# Patient Record
Sex: Female | Born: 1975 | Race: White | Hispanic: No | Smoking: Former smoker
Health system: Southern US, Community
[De-identification: ages and names within clinical notes are randomized; demographics above are authoritative.]

## PROBLEM LIST (undated history)

## (undated) DIAGNOSIS — R51 Headache: Secondary | ICD-10-CM

## (undated) DIAGNOSIS — G43909 Migraine, unspecified, not intractable, without status migrainosus: Secondary | ICD-10-CM

## (undated) DIAGNOSIS — F5081 Binge eating disorder: Secondary | ICD-10-CM

## (undated) DIAGNOSIS — M1990 Unspecified osteoarthritis, unspecified site: Secondary | ICD-10-CM

## (undated) DIAGNOSIS — T7840XA Allergy, unspecified, initial encounter: Secondary | ICD-10-CM

## (undated) DIAGNOSIS — R519 Headache, unspecified: Secondary | ICD-10-CM

## (undated) DIAGNOSIS — F32A Depression, unspecified: Secondary | ICD-10-CM

## (undated) DIAGNOSIS — N390 Urinary tract infection, site not specified: Secondary | ICD-10-CM

## (undated) DIAGNOSIS — F419 Anxiety disorder, unspecified: Secondary | ICD-10-CM

## (undated) DIAGNOSIS — F50819 Binge eating disorder, unspecified: Secondary | ICD-10-CM

## (undated) DIAGNOSIS — B019 Varicella without complication: Secondary | ICD-10-CM

## (undated) DIAGNOSIS — C439 Malignant melanoma of skin, unspecified: Secondary | ICD-10-CM

## (undated) DIAGNOSIS — E785 Hyperlipidemia, unspecified: Secondary | ICD-10-CM

## (undated) DIAGNOSIS — F329 Major depressive disorder, single episode, unspecified: Secondary | ICD-10-CM

## (undated) HISTORY — PX: MELANOMA EXCISION: SHX5266

## (undated) HISTORY — DX: Urinary tract infection, site not specified: N39.0

## (undated) HISTORY — DX: Anxiety disorder, unspecified: F41.9

## (undated) HISTORY — DX: Migraine, unspecified, not intractable, without status migrainosus: G43.909

## (undated) HISTORY — DX: Allergy, unspecified, initial encounter: T78.40XA

## (undated) HISTORY — DX: Headache, unspecified: R51.9

## (undated) HISTORY — DX: Unspecified osteoarthritis, unspecified site: M19.90

## (undated) HISTORY — DX: Headache: R51

## (undated) HISTORY — DX: Varicella without complication: B01.9

## (undated) HISTORY — DX: Binge eating disorder, unspecified: F50.819

## (undated) HISTORY — DX: Depression, unspecified: F32.A

## (undated) HISTORY — DX: Malignant melanoma of skin, unspecified: C43.9

## (undated) HISTORY — DX: Hyperlipidemia, unspecified: E78.5

## (undated) HISTORY — DX: Binge eating disorder: F50.81

---
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
## 1998-10-27 ENCOUNTER — Other Ambulatory Visit: Admission: RE | Admit: 1998-10-27 | Discharge: 1998-10-27 | Payer: Self-pay | Admitting: Obstetrics and Gynecology

## 1998-12-14 ENCOUNTER — Observation Stay (HOSPITAL_COMMUNITY): Admission: AD | Admit: 1998-12-14 | Discharge: 1998-12-15 | Payer: Self-pay | Admitting: Gynecology

## 1998-12-15 ENCOUNTER — Encounter: Payer: Self-pay | Admitting: Gynecology

## 1999-01-29 ENCOUNTER — Encounter: Payer: Self-pay | Admitting: Gynecology

## 1999-01-29 ENCOUNTER — Observation Stay (HOSPITAL_COMMUNITY): Admission: AD | Admit: 1999-01-29 | Discharge: 1999-01-30 | Payer: Self-pay | Admitting: Gynecology

## 1999-04-30 ENCOUNTER — Encounter (INDEPENDENT_AMBULATORY_CARE_PROVIDER_SITE_OTHER): Payer: Self-pay

## 1999-04-30 ENCOUNTER — Inpatient Hospital Stay (HOSPITAL_COMMUNITY): Admission: AD | Admit: 1999-04-30 | Discharge: 1999-05-03 | Payer: Self-pay | Admitting: Gynecology

## 1999-06-18 ENCOUNTER — Other Ambulatory Visit: Admission: RE | Admit: 1999-06-18 | Discharge: 1999-06-18 | Payer: Self-pay | Admitting: Gynecology

## 2001-08-10 ENCOUNTER — Other Ambulatory Visit: Admission: RE | Admit: 2001-08-10 | Discharge: 2001-08-10 | Payer: Self-pay | Admitting: Gynecology

## 2002-02-20 ENCOUNTER — Inpatient Hospital Stay (HOSPITAL_COMMUNITY): Admission: AD | Admit: 2002-02-20 | Discharge: 2002-02-23 | Payer: Self-pay | Admitting: Gynecology

## 2002-04-01 ENCOUNTER — Other Ambulatory Visit: Admission: RE | Admit: 2002-04-01 | Discharge: 2002-04-01 | Payer: Self-pay | Admitting: Gynecology

## 2003-06-12 ENCOUNTER — Other Ambulatory Visit: Admission: RE | Admit: 2003-06-12 | Discharge: 2003-06-12 | Payer: Self-pay | Admitting: Gynecology

## 2003-06-25 ENCOUNTER — Encounter: Admission: RE | Admit: 2003-06-25 | Discharge: 2003-06-25 | Payer: Self-pay | Admitting: Family Medicine

## 2003-07-08 ENCOUNTER — Encounter: Admission: RE | Admit: 2003-07-08 | Discharge: 2003-07-08 | Payer: Self-pay | Admitting: Infectious Diseases

## 2003-07-15 ENCOUNTER — Encounter: Admission: RE | Admit: 2003-07-15 | Discharge: 2003-07-15 | Payer: Self-pay | Admitting: Infectious Diseases

## 2004-07-08 ENCOUNTER — Other Ambulatory Visit: Admission: RE | Admit: 2004-07-08 | Discharge: 2004-07-08 | Payer: Self-pay | Admitting: Gynecology

## 2004-11-02 ENCOUNTER — Ambulatory Visit: Payer: Self-pay | Admitting: General Practice

## 2005-07-15 ENCOUNTER — Other Ambulatory Visit: Admission: RE | Admit: 2005-07-15 | Discharge: 2005-07-15 | Payer: Self-pay | Admitting: Gynecology

## 2005-10-25 IMAGING — CR DG CHEST 2V
2 series · 2 of 2 positions shown · non-contrast
Comparison: none

CLINICAL DATA: Fever, cough, assess for pneumonia.
 CHEST, TWO VIEWS
 Heart and mediastinum are normal.  Lungs are clear.  No effusions.  No soft tissue or bony abnormality.
 IMPRESSION
 1.  Normal chest.

[view not recorded (1 of 2)]
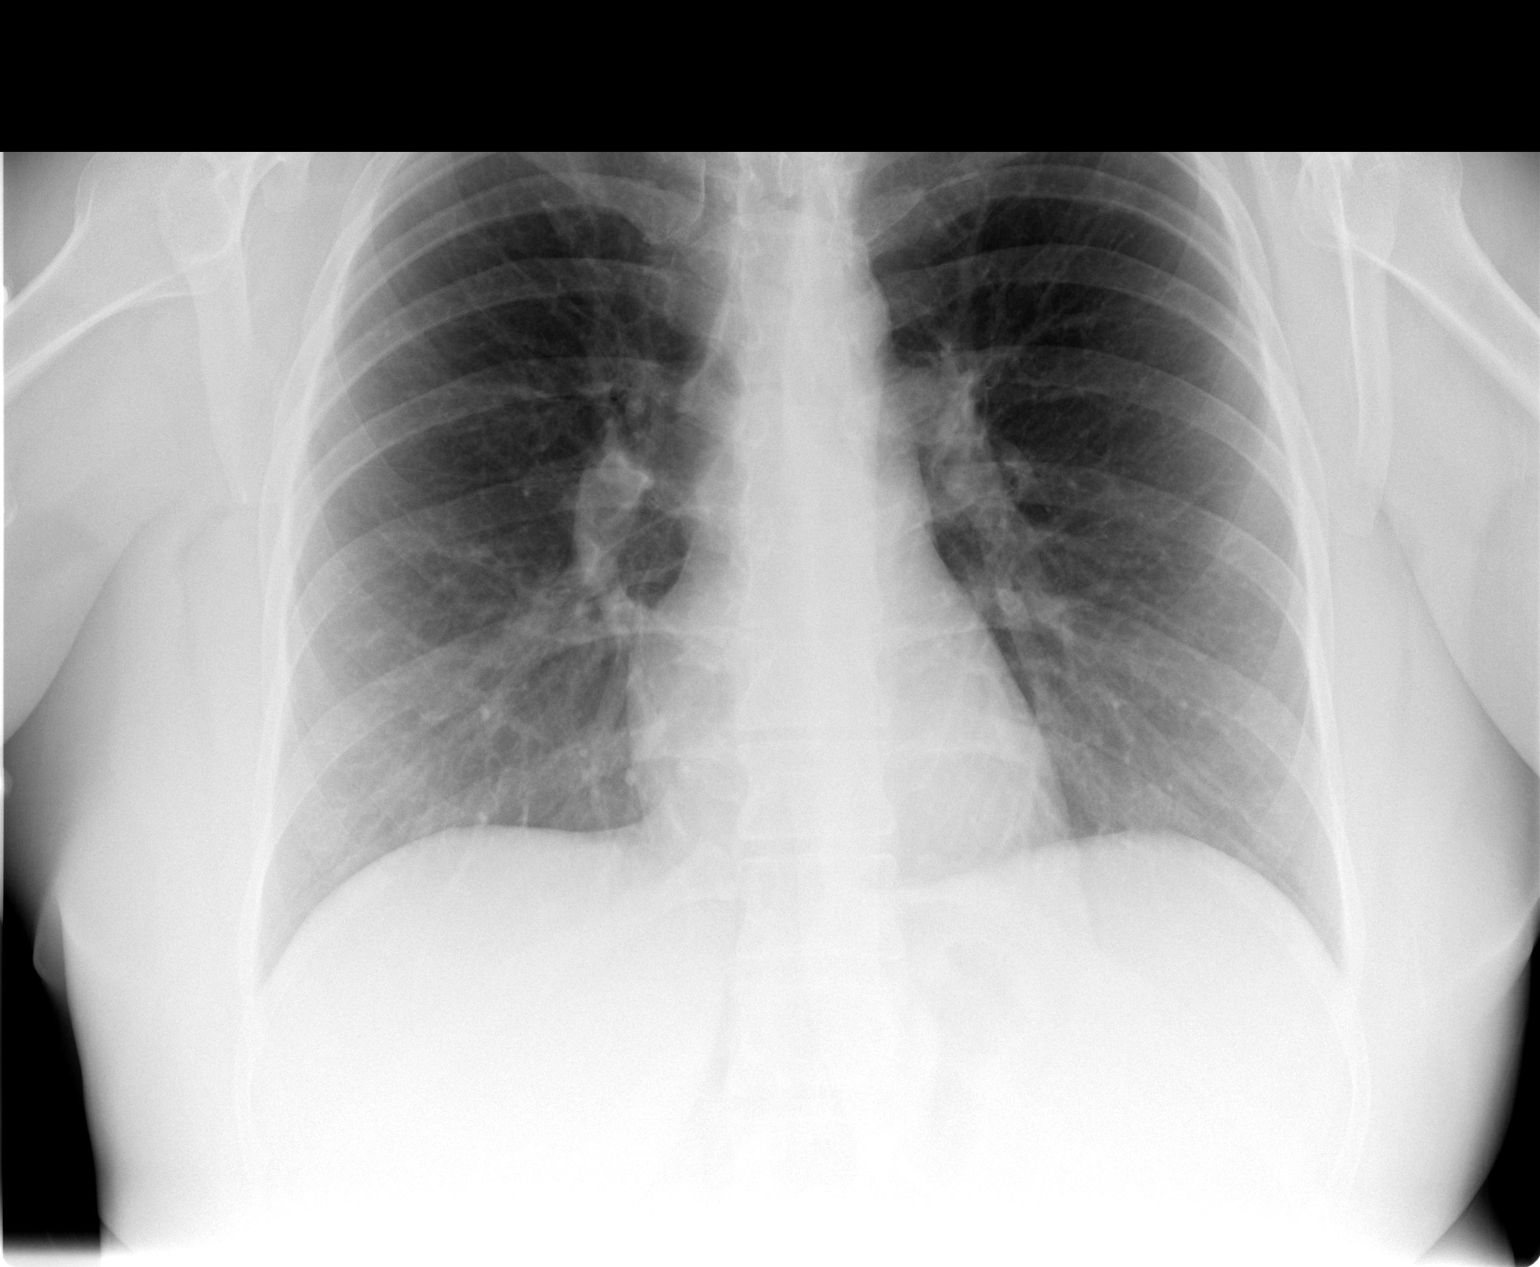

[view not recorded (2 of 2)]
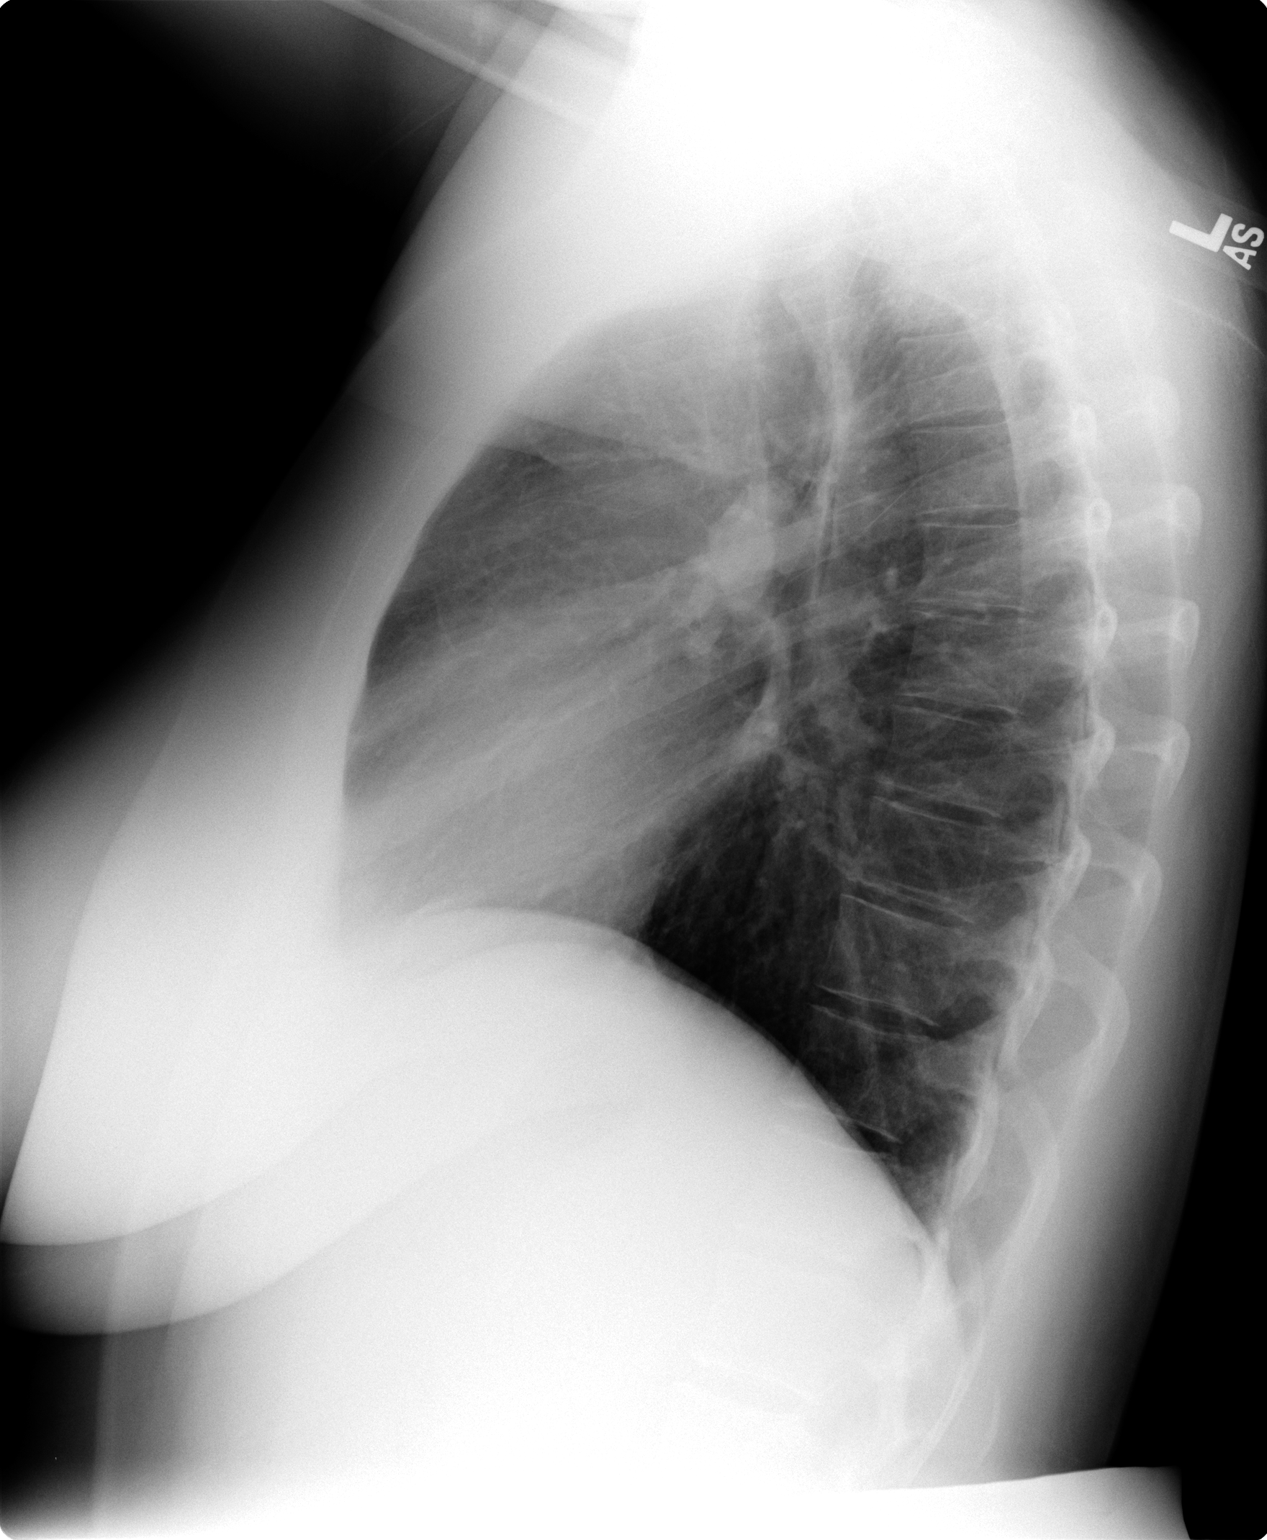

[2 of 2 positions shown; findings below may reference images not displayed]

## 2006-07-26 ENCOUNTER — Other Ambulatory Visit: Admission: RE | Admit: 2006-07-26 | Discharge: 2006-07-26 | Payer: Self-pay | Admitting: Gynecology

## 2008-05-09 ENCOUNTER — Ambulatory Visit: Payer: Self-pay | Admitting: Gynecology

## 2008-05-09 ENCOUNTER — Encounter: Payer: Self-pay | Admitting: Gynecology

## 2008-05-09 ENCOUNTER — Other Ambulatory Visit: Admission: RE | Admit: 2008-05-09 | Discharge: 2008-05-09 | Payer: Self-pay | Admitting: Gynecology

## 2009-01-31 HISTORY — PX: TONSILLECTOMY: SHX5217

## 2009-08-12 ENCOUNTER — Ambulatory Visit: Payer: Self-pay | Admitting: Gynecology

## 2009-08-12 ENCOUNTER — Other Ambulatory Visit: Admission: RE | Admit: 2009-08-12 | Discharge: 2009-08-12 | Payer: Self-pay | Admitting: Gynecology

## 2010-06-18 NOTE — Op Note (Signed)
NAME:  Sylvia [name], Sylvia [name]                          ACCOUNT NO.:  [id number]   MEDICAL RECORD NO.:  [id number]                   PATIENT TYPE:  INP   LOCATION:  9198                                 FACILITY:  [facility]   PHYSICIAN:  Timothy P. Fontaine, M.D.           DATE OF BIRTH:  11/01/1975   DATE OF PROCEDURE:  02/20/2002  DATE OF DISCHARGE:                                 OPERATIVE REPORT   PREOPERATIVE DIAGNOSES:  1. Pregnancy at term.  2. Prior cesarean section, desires repeat cesarean section.   POSTOPERATIVE DIAGNOSES:  1. Pregnancy at term.  2. Prior cesarean section, desires repeat cesarean section.   PROCEDURE:  Repeat low transverse cervical cesarean section.   SURGEON:  Timothy P. Fontaine, M.D.   ASSISTANT:  Ivor Costa. Farrel Gobble, M.D.   ANESTHESIA:  Spinal.   ESTIMATED BLOOD LOSS:  Less than 500 cubic centimeters.   COMPLICATIONS:  None.   SPECIMENS:  Samples of cord blood.   FINDINGS:  At 53 normal female infant.  Apgars 8 and 9.  Weight 8 pounds 7  ounces.  Umbilical hernia was noted in the infant.  The patient's pelvic  anatomy is noted to be normal.   PROCEDURE:  The patient is taken to the operating room.  Underwent spinal  anesthesia.  Was placed in the left tilt supine position.  Received an  abdominal preparation of Betadine solution.  The bladder was emptied with an  indwelling Foley catheter placed in [name] sterile technique.  The patient was  draped in the usual fashion after assuring adequate anesthesia.  Abdomen was  sharply entered through [name] repeat Pfannenstiel incision achieving adequate  hemostasis at all levels.  The bladder flap was sharply and bluntly  developed without difficulty and the uterus was sharply entered in the lower  uterine segment and bluntly extended laterally.  The membranes were  ruptured, the fluid noted to be clear.  The infant's head delivered through  the incision with the assistance of the vacuum extractor.  Nares and mouth  suctioned.  The rest of the infant delivered.  The cord doubly clamped and  cut and the infant was handed to pediatrics in attendance.  Samples of cord  bloods were obtained.  Placenta was then spontaneously extruded, noted to be  intact.  The uterus was exteriorized, the endometrial cavity explored with [name]  sponge to remove all placental and membrane fragments.  The uterine incision  was closed in one layer using 0 Vicryl suture in [name] running interlocking  stitch.  Several oozing areas were addressed using 0 Vicryl suture in figure-  of-eight interrupted stitch.  Uterus was then returned to the abdomen which  was copiously irrigated.  Adequate hemostasis visualized and the anterior  fascia was then reapproximated using 0 Vicryl suture in [name] running stitch  starting at the angle and meeting in the middle.  The subcutaneous tissues  were irrigated.  Adequate hemostasis  achieved with electrocautery  and the skin was reapproximated using 4-0 Vicryl in [name] running subcuticular  stitch.  Steri-Strips with Benzoin were applied.  Sterile dressing applied.  The patient was taken to recovery room in good condition having tolerated  procedure well.                                               Timothy P. Audie Box, M.D.    TPF/MEDQ  D:  02/20/2002  T:  02/20/2002  Job:  [id number]

## 2010-06-18 NOTE — H&P (Signed)
[facility]  Patient:    Sylvia [name]                        MRN: [id number] Adm. Date:  [id number] Attending:  Merrily Pew                         History and Physical  CHIEF COMPLAINT:              1. Right-sided pain.                               2. Pregnancy at 22 weeks.  HISTORY OF PRESENT ILLNESS:   [age]-year-old G3, P0, Ab1 female at 22-weeks gestation, doing well until today, with the onset of right-sided pain.  Patient  describes this as being midabdominal, radiating to her back, started dull, aching and has increased since then to a constant throbbing quality.  She denies uterine cramping, contractions, vaginal bleeding, rupture of membranes.  Does note some  urinary hesitancy although no frank dysuria.  Denies fever or chills.  Had bowel movement today without difficulty.  Last meal was lunch.  Did have some nausea earlier this evening although now feels better except for the persistence and worsening of her pain.  PAST MEDICAL HISTORY:         Uncomplicated.  PAST SURGICAL HISTORY:        Uncomplicated.  ALLERGIES:                    PENICILLIN.  MEDICATIONS:                  Prenatal vitamins.  SOCIAL HISTORY:               Cigarettes:  None.  Alcohol:  None.  PHYSICAL EXAMINATION:  GENERAL:                      Temperature 99, blood pressure 130/79, pulse 102.  HEENT:                        Normal.  LUNGS:                        Clear.  CARDIAC:                      Regular rate.  No gross rubs, murmurs or gallops.  ABDOMEN:                      Uterus appropriate for dates.  Positive fetal heart tones.  No contractions on external monitor.  Abdomen and uterus are soft without elicited tenderness.  No rebound, guarding or organomegaly.  BACK:                         Straight.  No CVA tenderness.  PELVIC:                       Cervix long, closed.  No evidence of rupture of membranes or  bleeding.  LABORATORY STUDIES:           Urinalysis is negative.  CBC shows a white count f 17,000.  Hematocrit -- mild anemia.  ASSESSMENT:  [age]-year-old gravida 2, para 2 female, 22-weeks gestation, right-sided pain from back to mid-abdomen, elevated white count, normal urinalysis.  Differential includes: #1 - Atypical appendicitis with elevated white count, although her physical exam is normal; #2 - renal lithiasis although again, her urinalysis is normal and without significant costovertebral  angle tenderness; #3 - cholelithiasis although Chem-24 ordered, now pending, physical exam certainly without any subcostal tenderness and pain appears to be  lower; #4 - pregnancy related, i.e., abruptio, certainly no overt evidence of this with soft uterus, no uterine activity on external monitors and no uterine tenderness.  PLAN:                         Will admit this evening, IV hydrate, keep n.p.o.,  repeat CBC in the morning and follow her symptoms this evening.  Will give her ild pain medicine with IV Demerol 25 to 50 mg and if pain persists and/or certainly  worsens with persistence of her elevated white count, may have general surgery ee in the morning.  If symptoms totally clear, then will follow expectantly. Chem-24 pending at this time and plan may certainly change pending these results. DD:  12/14/98 TD:  12/15/98 Job: [id number]

## 2010-06-18 NOTE — H&P (Signed)
   NAME:  Sylvia Sylvia [name], Sylvia Sylvia [name]                          ACCOUNT NO.:  [id number]   MEDICAL RECORD NO.:  [id number]                   PATIENT TYPE:  INP   LOCATION:  NA                                   FACILITY:  [facility]   PHYSICIAN:  Timothy P. Fontaine, M.D.           DATE OF BIRTH:  [birth year]   DATE OF ADMISSION:  02/20/2002  DATE OF DISCHARGE:                                HISTORY & PHYSICAL   CHIEF COMPLAINT:  Pregnancy at term, history of prior cesarean section,  desires repeat cesarean section.   HISTORY OF PRESENT ILLNESS:  The patient is Sylvia [name] 35 year-old Gravida III, Para  1, AB 1 female at [redacted] weeks gestation with Sylvia [name] history of prior cesarean  section for fetal macrosomia who desires repeat cesarean section after  counseling for Sylvia [name] trial of labor.  See [place] for remainder of her history  and physical.   ASSESSMENT:  37 seven year-old Sylvia Sylvia [name] ([place]) III, Para 1 female, [redacted] weeks  gestation for repeat cesarean section.  The risks, benefits, indications and  alternatives for the procedure were reviewed with the patient to include the  alternatives such as trial of labor.  The expected intraoperative and  postoperative courses were reviewed and the risks to include bleeding,  transfusion to include transfusion reaction, hepatitis, HIV, mad cow disease  and other unknown entities, infection both internal requiring prolonged  antibiotics as well as incisional complications requiring opening and  draining of incisions, closure by secondary intention were all discussed,  understood and accepted.  The risks of inadvertent injury to internal organs  including bowel, bladder, ureters, vessels and nerves necessitating major  exploratory reparative surgeries and future reparative surgeries including  ostomy formation were all discussed, understood and accepted.  The risks of  fetal injury during the procedure to include musculoskeletal, neural and  scalp injuries were all discussed, understood and  accepted.  The patient's  questions were answered to her satisfaction and she is ready to proceed with  surgery. The patient was questioned about tubal sterilization and she is not  interested in tubal sterilization.                                               Timothy P. Audie Box, M.D.    TPF/MEDQ  D:  02/19/2002  T:  02/19/2002  Job:  [id number]

## 2010-06-18 NOTE — Op Note (Signed)
[facility]  Patient:    Sylvia [name]                        MRN: [id number] Proc. Date: 04/30/99 Adm. Date:  [id number] Disc. Date: [id number] Attending:  Tonye Royalty                           Operative Report  PREOPERATIVE DIAGNOSES:       1. Pregnancy at [redacted] weeks gestation.                               2. Suspected fetal macrosomia.  POSTOPERATIVE DIAGNOSES:      1. Pregnancy at [redacted] weeks gestation.                               2. Suspected fetal macrosomia.  PROCEDURE:                    Primary low transverse cervical cesarean section.  SURGEON:                      Timothy P. Fontaine, M.D.  ANESTHESIA:                   Regional.  ESTIMATED BLOOD LOSS:         500 cc.  COMPLICATIONS:                None.  SPECIMEN:                     Placenta, samples of cord blood.  FINDINGS:                     At 68 normal female infant, Apgars 9 and 9, weight 9 pounds 12 ounces.  Pelvic anatomy noted to be normal.  PROCEDURE:                    Patient was taken to the operating room. Underwent regional anesthesia.  Was placed in the left tilt supine position.  Received an  abdominal preparation with Betadine scrub and Betadine solution.  Foley catheter was placed in a sterile technique and the patient was draped in the usual fashion. After assuring adequate anesthesia, the abdomen was sharply entered through a Pfannenstiel incision achieving adequate hemostasis at all levels.  The bladder  flap was then sharply and bluntly developed without difficulty.  The uterus was  sharply entered in the lower uterine segment.  The membranes ruptured and the uterine incision extended laterally bluntly.  The infants head was then delivered through the incision with the assistance of the vacuum extractor, the nares and  mouth suctioned, the rest of the infant delivered, the cord doubly clamped and ut, and the infant handed to pediatrics  in attendance.  Samples of cord blood were obtained.  The placenta was then spontaneously extruded, noted to be intact, and sent to pathology.  Patient received 1 g Cefotan IV prophylaxis.  The uterus was then exteriorized.  The endometrial cavity explored with the sponge to remove all placental membrane fragments.  The uterine incision was closed in one layer using 0 Vicryl suture in a running interlocking stitch.  Adequate hemostasis was visualized.  The uterus was  subsequently returned to the abdomen which was copiously irrigated.  Again, hemostasis visualized.  The anterior fascia reapproximated using 0 Vicryl suture in a running stitch.  Subcutaneous tissues  were irrigated.  Adequate hemostasis achieved with electrocautery.  The skin was reapproximated with staples.  Sterile dressing was applied and the patient was taken to the recovery room in good condition having tolerated procedure well. DD:  04/30/99 TD:  04/30/99 Job: [id number]

## 2010-06-18 NOTE — Discharge Summary (Signed)
   NAME:  Sylvia [name], SCRONCE A                          ACCOUNT NO.:  [id number]   MEDICAL RECORD NO.:  [id number]                   PATIENT TYPE:  INP   LOCATION:  9134                                 FACILITY:  [facility]   PHYSICIAN:  Juan H. Lily Peer, M.D.             DATE OF BIRTH:  [birth year]   DATE OF ADMISSION:  02/20/2002  DATE OF DISCHARGE:  02/23/2002                                 DISCHARGE SUMMARY   DISCHARGE DIAGNOSES:  1. Intrauterine pregnancy at 39 weeks, delivered.  2. History of prior cesarean section, desired repeat cesarean section.  3. Status post repeat low transverse cesarean section by Dr. Colin Broach on February 20, 2002.  4. History of group B strep with her previous pregnancy.  5. Upper respiratory infection.   HISTORY:  This is a 26-years-of-age female gravida 3 para 1 with an EDC by  sonogram of February 23, 2002.  Prenatal course had been complicated by  history of positive group B strep with previous pregnancy.  She had a  history of prior cesarean section and desired repeat cesarean section.   HOSPITAL COURSE:  On February 20, 2002 the patient was admitted and underwent  a repeat low transverse cesarean section by Dr. Colin Broach and  underwent delivery of a female, Apgars of 8 and 9, weight of 8 pounds 7  ounces.  It was noted that there was an umbilical hernia noted in the  infant.  There were no complications.  Postoperatively, the patient remained  afebrile, voiding, and in stable condition.  However, she did complain on  February 23, 2002 of sinus congestion, sore throat, and nonproductive cough.  On exam there were mild inspiratory rhonchi.  Therefore, the patient was  diagnosed with an upper respiratory infection and was given a prescription  for Ceftin.  She was discharged to home, however, in satisfactory condition.   ACCESSORY CLINICAL FINDINGS/LABORATORY DATA:  The patient is A positive,  rubella immune.   DISPOSITION:  1. The patient is  discharged to home.  2. Given a prescription for Ceftin 500 mg one p.o. b.i.d. x6 days.  3. Iron daily.  4. Tylox p.r.n. pain.     Susa Loffler, P.A.                    Juan H. Lily Peer, M.D.    TSG/MEDQ  D:  03/22/2002  T:  03/22/2002  Job:  [id number]

## 2010-06-18 NOTE — H&P (Signed)
[facility]  Patient:    Sylvia [name]                        MRN: [id number] Adm. Date:  [id number] Attending:  Tonye Royalty                         History and Physical  CHIEF COMPLAINT:  Left lower back pain.  HISTORY:  The patient is a 35 year old gravida 3, para 0, ab 1, currently 29-1/[redacted] week gestation.  Presented to the emergency room at [facility] early this morning complaining of low back pain that woke her up this morning.  She was previously admitted for observation on November 13 with pains that had started n her right abdomen and moving towards her back.  At that point she had an extensive evaluation to include blood work as well as ultrasound of the abdomen with negative findings.  The patient felt better after the observation and was discharged home. Today when she presented to [facility], her vital signs were as follows: Blood pressure 141/89, pulse 99, temperature pending at time of dictation.  She was placed on the monitor and reassuring fetal heart rate tracing was noted but no evidence of any contraction.  PHYSICAL EXAMINATION:  HEENT:  Unremarkable, but neck supple, trachea midline.  No carotid bruits, no thyromegaly.  LUNGS:  Clear to auscultation without any rhonchi or wheezes.  HEART:  Regular rate and rhythm.  No murmurs or gallops.  BREAST:  Examination not done.  The patient had some lumbosacral tenderness at the pressure point paraspinous lumbosacral region.  She had good motor and sensory deficit although she stated  that when walking she would feel discomfort in her back shooting through her side.  PELVIC:  Cervix is long, closed and posterior.  EXTREMITIES:  Deep tendon reflexes 1+, negative clonus, trace edema.  There was  negative Rovsing, negative obturator, negative psoas and negative heel tap sign. Her abdomen was soft and nontender without any rebound or  guarding.  LABORATORY DATA:  The patient had a following CBC with a white blood cell count of 13,900, hemoglobin and hematocrit of 10.5 and 31.0.  Platelet count 154,000. Comprehensive metabolic panel raises some issue due to the fact that her potassium was found to be elevated at 6.2 and the SGOT was elevated at 89 and total bilirubin was 2.3.  Patient was not jaundiced and wondering if the blood sample that was obtained was hemolyzed.  She also had an ultrasound as well as a urinalysis. The abdominal ultrasound did not reveal any evidence of cholelithiasis or biliary dilatation.  There was mild bilateral pelviectasis consistent with pregnancy. he remainder of the abdominal ultrasound was reported to be negative as reported by the radiologist.  The obstetrical ultrasound did not demonstrate any evidence of any abruptio.  The size is consistent with the dates.  Fetus was in the vertex presentation.  Subjectively the amniotic fluid ____________ to be low with an AFI of 10.4, 23rd percentile although due to patient discomfort, moving around, an adequate assessment was not possible.  Estimated fetal weight was 1528 gm. Urinalysis was negative.  Specific gravity was 1.020.  There was no evidence of  ketonuria.  ASSESSMENT:  35 year old gravida 3, para 0, ab 1 at 29-1/[redacted] weeks gestation with  left lumbosacral pains radiating to the left abdominal side.  Last month she was admitted for observation with pain  starting in the right lower abdomen and shooting to her back.  On examination and a working diagnosis of possibility of (1) herniated disk, (2) ruptured disk, (3) sciatica, (4) lumbosacral strain ____________ patient stated this pain occurs twice a week since she left the hospital was worse this morning.  She did receive 50 mg of Demerol and 12.5 mg f Phenergan IM which states that gave her minimal resolution.  She looks uncomfortable but exam does really not correlate with  patients symptomatology or the work-up so far.  Will plan on admitting her, hydrating her, repeating a comprehensive metabolic panel to see if indeed it was a lab error due to possible hemolysis of the blood due to the hyperkalemia and the elevated bilirubin and elevated SGOT.  We will proceed with an MRI of the lumbosacral region to rule out herniated disk or ruptured disk and if so, will proceed with consultation with  neurosurgeon or orthopedist.  Will continue to monitor antepartum to rule out any evidence of premature labor.  She has had a reassuring fetal heart rate tracing in maternity admission with no evidence of contractions noted.  Her vital signs have also been stable as well.  All of these issues were discussed with the patient nd will continue to monitor accordingly and all questions were answered.  PLAN:  As per assessment above. DD:  01/29/99 TD:  01/29/99 Job: [id number]

## 2010-06-18 NOTE — Discharge Summary (Signed)
[facility]  Patient:    Sylvia [name], Sylvia [name]                       MRN: [id number] Adm. Date:  [id number] Disc. Date: [id number] Attending:  Merrily Pew Dictator:   Antony Contras, RNC, [facility]                           Discharge Summary  DISCHARGE DIAGNOSES:          1. Intrauterine pregnancy at 41 weeks.                               2. Fetal macrosomia.                               3. Oligohydramnios.                               4. Early labor.                               5. Delivery of viable infant.  PROCEDURE:                    Low transverse cesarean section.  HISTORY OF PRESENT ILLNESS:   The patient is a 35 year old, gravida 2, para 0-0-1-0, with an LMP of July 16, 1998, [facility] April 27, 1999.  Pregnancy was complicated by oligohydramnios and fetal macrosomia which became evident at the end of the pregnancy.  PRENATAL LABORATORY DATA:     Blood type A positive, antibody screen negative, rubella positive.  RPR, HBSAG, and HIV nonreactive.  MSAFP normal.  Glucose within normal limits.  HOSPITAL COURSE:              The patient was admitted for low transverse cesarean section due to macrosomia and the oligohydramnios.  The procedure was performed by Timothy P. Fontaine, M.D. under regional anesthesia.  She was delivered of an Apgars 9 and 37 female infant, weight 9 pounds 12 ounces.  Estimated blood loss was 500 cc.  Postoperative course was within normal limits.  She remained afebrile,  voiding well, and was able to be discharged on her third postoperative day. Postoperative CBC; hemoglobin 11.3, hematocrit 32.4, platelets 117, white count  12.4.  Discharged with her infant in satisfactory condition.  DISPOSITION:                  Follow up in four to six weeks.  Continue with prenatal vitamins and iron.  Tylox or Motrin for pain. DD:  05/28/99 TD:  06/02/99 Job: [id number]

## 2010-06-18 NOTE — H&P (Signed)
[facility]  Patient:    Sylvia [name]                        MRN: [id number] Adm. Date:  [id number] Disc. Date: [id number] Attending:  Tonye Royalty                         History and Physical  CHIEF COMPLAINT:              Pregnancy at 41 weeks, fetal macrosomia, oligohydramnios, early labor.  HISTORY OF PRESENT ILLNESS:   [age]-year-old G2, P0, Ab1 female at 41-weeks gestation, evaluated in the office with ultrasound which showed an AFI of 2.2 cm. Estimated fetal weight was 4400 g and on a reactive fetal tracing, she was contracting every six minute.  PAST MEDICAL HISTORY:         Uncomplicated.  PAST SURGICAL HISTORY:        D&C; wisdom tooth extraction.  ALLERGIES:                    PENICILLIN.  MEDICATIONS:                  Vitamins.  REVIEW OF SYSTEMS:            Noncontributory.  FAMILY HISTORY:               Noncontributory.  SOCIAL HISTORY:               No cigarettes or alcohol use.  PHYSICAL EXAMINATION:  VITAL SIGNS:                  Afebrile.  Vital signs are stable.  HEENT:                        Normal.  LUNGS:                        Clear.  CARDIAC:                      Regular rate.  No murmurs, rubs, or gallops.  ABDOMEN:                      Gravid vertex presentation, reactive fetal tracing, contractions every six minutes.  PELVIC:                       Cervix fingertip, 80-90%, -3 station.  ASSESSMENT:                   [age]-year-old gravida 2, para 0, abortus 1  female, 41-weeks gestation, with ultrasound estimated fetal weight of 4400 g, clinically large infant on exam, probable early labor with oligohydramnios. Discussed with patient and her husband the issues of estimated fetal weight by ultrasound and the issues of fetal macrosomia leading to increased risk for cesarean section as well as increased risk for shoulder dystocia.  Options of expectant management with spontaneous  labor allowing to progress with no heroic  interventions with operative vaginal delivery and attempting vaginal delivery versus primary cesarean section were reviewed and the patient and her husband elect for primary cesarean section.  The risks of C-section were discussed with them o include the risks of infection, uterine and peritoneal, leading to prolonged antibiotics, as well as incisional requiring  opening and draining of incisions nd closure by by secondary intention, risk of bleeding leading to hemorrhage necessitating transfusion and the risks of transfusion up to and including human immunodeficiency virus were discussed with her.  The risks of inadvertent injury to internal organs including bowel, bladder, ureters, vessels and nerves necessitating major exploratory or reparative surgeries and future reparative surgeries including ostomy formation were discussed, understood and accepted.  After an extensive discussion to include the inherent inaccuracies in ultrasound estimated fetal weights, the risks of labor with a large infant to include cesarean section after a long trial of labor as well as shoulder dystocia versus a primary cesarean section with the risks of a cesarean section were all reviewed, the patient and her husband want to proceed with C-section.  The patients questions were answered to her satisfaction and she is ready to proceed with surgery. DD:  04/30/99 TD:  04/30/99 Job: [id number]

## 2010-08-17 ENCOUNTER — Other Ambulatory Visit: Payer: Self-pay | Admitting: Gynecology

## 2010-08-17 ENCOUNTER — Other Ambulatory Visit (HOSPITAL_COMMUNITY)
Admission: RE | Admit: 2010-08-17 | Discharge: 2010-08-17 | Disposition: A | Discharge status: Home / Self Care | Payer: BC Managed Care – PPO | Source: Ambulatory Visit | Attending: Gynecology | Admitting: Gynecology

## 2010-08-17 ENCOUNTER — Encounter (INDEPENDENT_AMBULATORY_CARE_PROVIDER_SITE_OTHER): Payer: BC Managed Care – PPO | Admitting: Gynecology

## 2010-08-17 DIAGNOSIS — Z124 Encounter for screening for malignant neoplasm of cervix: Secondary | ICD-10-CM | POA: Insufficient documentation

## 2010-08-17 DIAGNOSIS — Z833 Family history of diabetes mellitus: Secondary | ICD-10-CM

## 2010-08-17 DIAGNOSIS — Z01419 Encounter for gynecological examination (general) (routine) without abnormal findings: Secondary | ICD-10-CM

## 2010-08-17 DIAGNOSIS — R809 Proteinuria, unspecified: Secondary | ICD-10-CM

## 2010-08-17 DIAGNOSIS — Z13220 Encounter for screening for lipoid disorders: Secondary | ICD-10-CM

## 2010-08-27 ENCOUNTER — Encounter: Payer: Self-pay | Admitting: Gynecology

## 2010-08-27 ENCOUNTER — Ambulatory Visit (INDEPENDENT_AMBULATORY_CARE_PROVIDER_SITE_OTHER): Payer: BC Managed Care – PPO | Admitting: Gynecology

## 2010-08-27 VITALS — BP 122/78

## 2010-08-27 DIAGNOSIS — Z30431 Encounter for routine checking of intrauterine contraceptive device: Secondary | ICD-10-CM

## 2010-08-27 MED ORDER — LEVONORGESTREL 20 MCG/24HR IU IUD
INTRAUTERINE_SYSTEM | Freq: Once | INTRAUTERINE | Status: AC
  Administered 2010-08-27: 10:00:00 via INTRAUTERINE

## 2010-08-27 NOTE — Progress Notes (Signed)
  Sylvia Bell 1975-07-31 [id number]   Patient presents to have her IUD replaced at a five-year interval. She's without complaints and doing well with the IUD.   Exam  Pelvic: External genitalia:  Normal   BUS/Urethra/Skene's glands:  Normal   Bladder:  Normal   Vagina:  Normal   Cervix:  Normal IUD string not visualized  Uterus:  Anti-verted, normal in size, shape and contour.  Midline and mobile nontender   Adnexa/parametria:  Without masses or tenderness   Anus and perineum: Normal      Procedure note :  The patient was counseled as to the removal and reinsertion process and I discussed the risks to include infection both short-term and long-term uterine perforation or IUD migration requiring surgery to remove as well as the risk of failure and pregnancy with IUD. She understands and accepts all of this she has read through the Mirena booklet and has signed the consent form which has been scanned into her Epic chart.  Patient was examined as above subsequently cervix visualized with speculum cleansed with Betadine initial probing of the os was unable to grasp the IUD string. Subsequently anterior lip of the cervix was grasped with a single-tooth tenaculum and using the [facility] forcep inserted up into the upper endocervical canal I was able to grab the string and ultimately retrieve her IUD. I showed her the old IUD and discarded subsequently uterus was sounded and a new Mirena IUD was placed according to manufacturer's recommendations. String was trimmed tenaculum removed patient tolerated procedure well and will followup for a postinsertional check in one month.    Dara Lords MD, 10:05 AM 08/27/2010

## 2010-08-30 ENCOUNTER — Other Ambulatory Visit: Payer: Self-pay | Admitting: Gynecology

## 2010-09-01 ENCOUNTER — Other Ambulatory Visit: Payer: Self-pay | Admitting: *Deleted

## 2010-09-01 DIAGNOSIS — F419 Anxiety disorder, unspecified: Secondary | ICD-10-CM

## 2010-09-01 MED ORDER — VENLAFAXINE HCL ER 150 MG PO CP24: 150.0000 mg | ORAL_CAPSULE | Freq: Every day | ORAL | Status: DC

## 2010-09-23 ENCOUNTER — Ambulatory Visit (INDEPENDENT_AMBULATORY_CARE_PROVIDER_SITE_OTHER): Payer: BC Managed Care – PPO | Admitting: Gynecology

## 2010-09-23 ENCOUNTER — Encounter: Payer: Self-pay | Admitting: Gynecology

## 2010-09-23 DIAGNOSIS — Z30431 Encounter for routine checking of intrauterine contraceptive device: Secondary | ICD-10-CM

## 2010-09-23 NOTE — Progress Notes (Signed)
Patient presents for IUD check doing well no complaints.  Exam Pelvic: External BUS vagina normal, cervix normal IUD string not visualized, bimanual uterus normal size midline mobile nontender, adnexa without masses or tenderness.  Colposcopic evaluation still unable to see the string at the cervical os  Assessment and plan: IUD check unable to see the string. Patient will schedule ultrasound to assure intrauterine placement.

## 2010-10-07 ENCOUNTER — Ambulatory Visit (INDEPENDENT_AMBULATORY_CARE_PROVIDER_SITE_OTHER): Payer: BC Managed Care – PPO | Admitting: Gynecology

## 2010-10-07 ENCOUNTER — Other Ambulatory Visit: Payer: BC Managed Care – PPO

## 2010-10-07 DIAGNOSIS — Z30431 Encounter for routine checking of intrauterine contraceptive device: Secondary | ICD-10-CM

## 2010-10-07 DIAGNOSIS — T8339XA Other mechanical complication of intrauterine contraceptive device, initial encounter: Secondary | ICD-10-CM

## 2010-10-07 DIAGNOSIS — D259 Leiomyoma of uterus, unspecified: Secondary | ICD-10-CM

## 2010-10-07 DIAGNOSIS — D251 Intramural leiomyoma of uterus: Secondary | ICD-10-CM

## 2010-10-07 DIAGNOSIS — N831 Corpus luteum cyst of ovary, unspecified side: Secondary | ICD-10-CM

## 2010-10-07 NOTE — Progress Notes (Signed)
Patient presents for ultrasound for IUD placement. 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 The IUD is in the proper location endometrial echo is 10 mm.  Discussed all this with the patient. Assuming she continues well then she will see [place] when she is due for her annual next July sooner as needed.

## 2013-10-11 ENCOUNTER — Ambulatory Visit: Payer: Self-pay | Admitting: Gynecology

## 2013-12-02 ENCOUNTER — Encounter: Payer: Self-pay | Admitting: Gynecology

## 2013-12-10 ENCOUNTER — Ambulatory Visit: Payer: Self-pay | Admitting: Gynecology

## 2014-01-15 ENCOUNTER — Ambulatory Visit: Payer: Self-pay | Admitting: Gynecology

## 2014-03-04 ENCOUNTER — Other Ambulatory Visit (HOSPITAL_COMMUNITY)
Admission: RE | Admit: 2014-03-04 | Discharge: 2014-03-04 | Disposition: A | Discharge status: Home / Self Care | Payer: BC Managed Care – PPO | Source: Ambulatory Visit | Attending: Gynecology | Admitting: Gynecology

## 2014-03-04 ENCOUNTER — Encounter: Payer: Self-pay | Admitting: Gynecology

## 2014-03-04 ENCOUNTER — Ambulatory Visit (INDEPENDENT_AMBULATORY_CARE_PROVIDER_SITE_OTHER): Payer: BC Managed Care – PPO | Admitting: Gynecology

## 2014-03-04 VITALS — BP 122/78 | Ht 69.0 in | Wt 235.0 lb

## 2014-03-04 DIAGNOSIS — Z1151 Encounter for screening for human papillomavirus (HPV): Secondary | ICD-10-CM | POA: Diagnosis present

## 2014-03-04 DIAGNOSIS — Z01419 Encounter for gynecological examination (general) (routine) without abnormal findings: Secondary | ICD-10-CM | POA: Diagnosis not present

## 2014-03-04 DIAGNOSIS — Z30431 Encounter for routine checking of intrauterine contraceptive device: Secondary | ICD-10-CM

## 2014-03-04 LAB — CBC WITH DIFFERENTIAL/PLATELET
BASOS ABS: 0 10*3/uL (ref 0.0–0.1)
Basophils Relative: 0 % (ref 0–1)
Eosinophils Absolute: 0.1 10*3/uL (ref 0.0–0.7)
Eosinophils Relative: 1 % (ref 0–5)
HEMATOCRIT: 42.6 % (ref 36.0–46.0)
HEMOGLOBIN: 14.4 g/dL (ref 12.0–15.0)
LYMPHS PCT: 22 % (ref 12–46)
Lymphs Abs: 2.6 10*3/uL (ref 0.7–4.0)
MCH: 28.8 pg (ref 26.0–34.0)
MCHC: 33.8 g/dL (ref 30.0–36.0)
MCV: 85.2 fL (ref 78.0–100.0)
MPV: 11 fL (ref 8.6–12.4)
Monocytes Absolute: 0.7 10*3/uL (ref 0.1–1.0)
Monocytes Relative: 6 % (ref 3–12)
NEUTROS ABS: 8.5 10*3/uL — AB (ref 1.7–7.7)
NEUTROS PCT: 71 % (ref 43–77)
Platelets: 243 10*3/uL (ref 150–400)
RBC: 5 MIL/uL (ref 3.87–5.11)
RDW: 13.6 % (ref 11.5–15.5)
WBC: 12 10*3/uL — AB (ref 4.0–10.5)

## 2014-03-04 NOTE — Progress Notes (Signed)
PENNELOPE BASQUE 1975/02/15 [id number]        39 y.o.  [id number] for annual exam.  Has not been in the office since 2012. Several issues noted below.  Past medical history,surgical history, problem list, medications, allergies, family history and social history were all reviewed and documented as reviewed in the EPIC chart.  ROS:  Performed with pertinent positives and negatives included in the history, assessment and plan.   Additional significant findings :  none   Exam: Kim Counsellor Vitals:   03/04/14 1552  BP: 122/78  Height: 5\' 9"  (1.753 m)  Weight: 235 lb (106.595 kg)   General appearance:  Normal affect, orientation and appearance. Skin: Grossly normal HEENT: Without gross lesions.  No cervical or supraclavicular adenopathy. Thyroid normal.  Lungs:  Clear without wheezing, rales or rhonchi Cardiac: RR, without RMG Abdominal:  Soft, nontender, without masses, guarding, rebound, organomegaly or hernia Breasts:  Examined lying and sitting without masses, retractions, discharge or axillary adenopathy. Pelvic:  Ext/BUS/vagina normal  Cervix normal. IUD string not visualized or palpated. Pap/HPV  Uterus anteverted, normal size, shape and contour, midline and mobile nontender   Adnexa  Without masses or tenderness    Anus and perineum  Normal   Rectovaginal  Normal sphincter tone without palpated masses or tenderness.    Assessment/Plan:  39 y.o. [id number] female for annual exam without menses, Mirena IUD 08/2010.   1. Mirena 08/2010. Patient doing well without menses. String not visualized. Has not been in the past noting ultrasound demonstrated intrauterine placement at last visit. Remains amenorrheic. Options to relook now with ultrasound or follow reviewed. Patient's comfortable with following now. Due to have replaced in 08/2015 2. Pap smear 2012. Pap/HPV today. No history of significant abnormal Pap smears previously. 3. Mammography several years ago. Turning 39 this fall.  Recommend mammogram this year. Does have a history of her mother being diagnosed in her 44s with breast cancer and her maternal grandmother also being diagnosed with breast cancer. I discussed with her in the past and I again reviewed today the possibility of genetic linkage. She is unclear whether her mother was genetically tested although she is being seen by an oncologist. I reviewed with her the implications if her mother is gene positive for the patient's testing in what she would do with the results to include prophylactic surgeries as well as more intensive surveillance such as MRIs. The patient's going to discuss with her mother to verify whether she was genetically tested. If there is any question I offered the patient the option to discuss with a genetic counselor and to have genetic testing done for herself. Patient will follow up after talking to her mother she wants to pursue genetic testing or has any further questions. Regardless recommended mammogram this year and every year there after. SBE monthly reviewed. 4. Stop smoking reviewed. 5. Health maintenance. Baseline CBC comprehensive metabolic panel lipid profile urinalysis TSH ordered. Follow up in one year, sooner as needed.     Anastasio Auerbach MD, 4:48 PM 03/04/2014

## 2014-03-04 NOTE — Patient Instructions (Signed)
Call to Schedule your mammogram  Facilities in [place]: 1)  [facility], [address] Phone: [phone] 2)  [facility]. [address] [place] Phone: [phone] 3)  Dr. Isaiah Blakes at [facility] [address] Phone: [phone]     Mammogram A mammogram is an X-ray test to find changes in a woman's breast. You should get a mammogram if:  You are 40 years of age or older  You have risk factors.   Your doctor recommends that you have one.  BEFORE THE TEST  Do not schedule the test the week before your period, especially if your breasts are sore during this time.  On the day of your mammogram:  Wash your breasts and armpits well. After washing, do not put on any deodorant or talcum powder on until after your test.   Eat and drink as you usually do.   Take your medicines as usual.   If you are diabetic and take insulin, make sure you:   Eat before coming for your test.   Take your insulin as usual.   If you cannot keep your appointment, call before the appointment to cancel. Schedule another appointment.  TEST  You will need to undress from the waist up. You will put on a hospital gown.   Your breast will be put on the mammogram machine, and it will press firmly on your breast with a piece of plastic called a compression paddle. This will make your breast flatter so that the machine can X-ray all parts of your breast.   Both breasts will be X-rayed. Each breast will be X-rayed from above and from the side. An X-ray might need to be taken again if the picture is not good enough.   The mammogram will last about 15 to 30 minutes.  AFTER THE TEST Finding out the results of your test Ask when your test results will be ready. Make sure you get your test results.  Document Released: 04/15/2008 Document Revised: 01/06/2011 Document Reviewed: 04/15/2008 [facility] Patient  Information 2012 [place].  You may obtain a copy of any labs that were done today by logging onto MyChart as outlined in the instructions provided with your AVS (after visit summary). The office will not call with normal lab results but certainly if there are any significant abnormalities then we will contact you.   Health Maintenance, Female A healthy lifestyle and preventative care can promote health and wellness.  Maintain regular health, dental, and eye exams.  Eat a healthy diet. Foods like vegetables, fruits, whole grains, low-fat dairy products, and lean protein foods contain the nutrients you need without too many calories. Decrease your intake of foods high in solid fats, added sugars, and salt. Get information about a proper diet from your caregiver, if necessary.  Regular physical exercise is one of the most important things you can do for your health. Most adults should get at least 150 minutes of moderate-intensity exercise (any activity that increases your heart rate and causes you to sweat) each week. In addition, most adults need muscle-strengthening exercises on 2 or more days a week.   Maintain a healthy weight. The body mass index (BMI) is a screening tool to identify possible weight problems. It provides an estimate of body fat based on height and weight. Your caregiver can help determine your BMI, and can help you achieve or maintain a healthy weight. For adults  20 years and older:  A BMI below 18.5 is considered underweight.  A BMI of 18.5 to 24.9 is normal.  A BMI of 25 to 29.9 is considered overweight.  A BMI of 30 and above is considered obese.  Maintain normal blood lipids and cholesterol by exercising and minimizing your intake of saturated fat. Eat a balanced diet with plenty of fruits and vegetables. Blood tests for lipids and cholesterol should begin at age 20 and be repeated every 5 years. If your lipid or cholesterol levels are high, you are over 50, or  you are a high risk for heart disease, you may need your cholesterol levels checked more frequently.Ongoing high lipid and cholesterol levels should be treated with medicines if diet and exercise are not effective.  If you smoke, find out from your caregiver how to quit. If you do not use tobacco, do not start.  Lung cancer screening is recommended for adults aged 55 80 years who are at high risk for developing lung cancer because of a history of smoking. Yearly low-dose computed tomography (CT) is recommended for people who have at least a 30-pack-year history of smoking and are a current smoker or have quit within the past 15 years. A pack year of smoking is smoking an average of 1 pack of cigarettes a day for 1 year (for example: 1 pack a day for 30 years or 2 packs a day for 15 years). Yearly screening should continue until the smoker has stopped smoking for at least 15 years. Yearly screening should also be stopped for people who develop a health problem that would prevent them from having lung cancer treatment.  If you are pregnant, do not drink alcohol. If you are breastfeeding, be very cautious about drinking alcohol. If you are not pregnant and choose to drink alcohol, do not exceed 1 drink per day. One drink is considered to be 12 ounces (355 mL) of beer, 5 ounces (148 mL) of wine, or 1.5 ounces (44 mL) of liquor.  Avoid use of street drugs. Do not share needles with anyone. Ask for help if you need support or instructions about stopping the use of drugs.  High blood pressure causes heart disease and increases the risk of stroke. Blood pressure should be checked at least every 1 to 2 years. Ongoing high blood pressure should be treated with medicines, if weight loss and exercise are not effective.  If you are 55 to 40 years old, ask your caregiver if you should take aspirin to prevent strokes.  Diabetes screening involves taking a blood sample to check your fasting blood sugar level. This  should be done once every 3 years, after age 45, if you are within normal weight and without risk factors for diabetes. Testing should be considered at a younger age or be carried out more frequently if you are overweight and have at least 1 risk factor for diabetes.  Breast cancer screening is essential preventative care for women. You should practice "breast self-awareness." This means understanding the normal appearance and feel of your breasts and may include breast self-examination. Any changes detected, no matter how small, should be reported to a caregiver. Women in their 20s and 30s should have a clinical breast exam (CBE) by a caregiver as part of a regular health exam every 1 to 3 years. After age 40, women should have a CBE every year. Starting at age 40, women should consider having a mammogram (breast X-ray) every year. Women who have a   family history of breast cancer should talk to their caregiver about genetic screening. Women at a high risk of breast cancer should talk to their caregiver about having an MRI and a mammogram every year.  Breast cancer gene (BRCA)-related cancer risk assessment is recommended for women who have family members with BRCA-related cancers. BRCA-related cancers include breast, ovarian, tubal, and peritoneal cancers. Having family members with these cancers may be associated with an increased risk for harmful changes (mutations) in the breast cancer genes BRCA1 and BRCA2. Results of the assessment will determine the need for genetic counseling and BRCA1 and BRCA2 testing.  The Pap test is a screening test for cervical cancer. Women should have a Pap test starting at age 74. Between ages 61 and 30, Pap tests should be repeated every 2 years. Beginning at age 40, you should have a Pap test every 3 years as long as the past 3 Pap tests have been normal. If you had a hysterectomy for a problem that was not cancer or a condition that could lead to cancer, then you no longer  need Pap tests. If you are between ages 61 and 63, and you have had normal Pap tests going back 10 years, you no longer need Pap tests. If you have had past treatment for cervical cancer or a condition that could lead to cancer, you need Pap tests and screening for cancer for at least 20 years after your treatment. If Pap tests have been discontinued, risk factors (such as a new sexual partner) need to be reassessed to determine if screening should be resumed. Some women have medical problems that increase the chance of getting cervical cancer. In these cases, your caregiver may recommend more frequent screening and Pap tests.  The human papillomavirus (HPV) test is an additional test that may be used for cervical cancer screening. The HPV test looks for the virus that can cause the cell changes on the cervix. The cells collected during the Pap test can be tested for HPV. The HPV test could be used to screen women aged 71 years and older, and should be used in women of any age who have unclear Pap test results. After the age of 30, women should have HPV testing at the same frequency as a Pap test.  Colorectal cancer can be detected and often prevented. Most routine colorectal cancer screening begins at the age of 48 and continues through age 41. However, your caregiver may recommend screening at an earlier age if you have risk factors for colon cancer. On a yearly basis, your caregiver may provide home test kits to check for hidden blood in the stool. Use of a small camera at the end of a tube, to directly examine the colon (sigmoidoscopy or colonoscopy), can detect the earliest forms of colorectal cancer. Talk to your caregiver about this at age 56, when routine screening begins. Direct examination of the colon should be repeated every 5 to 10 years through age 51, unless early forms of pre-cancerous polyps or small growths are found.  Hepatitis C blood testing is recommended for all people born from [birth year]  through 1965 and any individual with known risks for hepatitis C.  Practice safe sex. Use condoms and avoid high-risk sexual practices to reduce the spread of sexually transmitted infections (STIs). Sexually active women aged 40 and younger should be checked for Chlamydia, which is a common sexually transmitted infection. Older women with new or multiple partners should also be tested for Chlamydia. Testing for  other STIs is recommended if you are sexually active and at increased risk.  Osteoporosis is a disease in which the bones lose minerals and strength with aging. This can result in serious bone fractures. The risk of osteoporosis can be identified using a bone density scan. Women ages 49 and over and women at risk for fractures or osteoporosis should discuss screening with their caregivers. Ask your caregiver whether you should be taking a calcium supplement or vitamin D to reduce the rate of osteoporosis.  Menopause can be associated with physical symptoms and risks. Hormone replacement therapy is available to decrease symptoms and risks. You should talk to your caregiver about whether hormone replacement therapy is right for you.  Use sunscreen. Apply sunscreen liberally and repeatedly throughout the day. You should seek shade when your shadow is shorter than you. Protect yourself by wearing long sleeves, pants, a wide-brimmed hat, and sunglasses year round, whenever you are outdoors.  Notify your caregiver of new moles or changes in moles, especially if there is a change in shape or color. Also notify your caregiver if a mole is larger than the size of a pencil eraser.  Stay current with your immunizations. Document Released: 08/02/2010 Document Revised: 05/14/2012 Document Reviewed: 08/02/2010 [facility] Patient Information 2014 [place].

## 2014-03-04 NOTE — Addendum Note (Signed)
Addended by: Nelva Nay on: 03/04/2014 05:01 PM   Modules accepted: Orders

## 2014-03-05 ENCOUNTER — Other Ambulatory Visit: Payer: Self-pay | Admitting: Gynecology

## 2014-03-05 DIAGNOSIS — E7800 Pure hypercholesterolemia, unspecified: Secondary | ICD-10-CM

## 2014-03-05 LAB — TSH: TSH: 0.909 u[IU]/mL (ref 0.350–4.500)

## 2014-03-05 LAB — COMPREHENSIVE METABOLIC PANEL
ALBUMIN: 4.2 g/dL (ref 3.5–5.2)
ALT: 19 U/L (ref 0–35)
AST: 14 U/L (ref 0–37)
Alkaline Phosphatase: 61 U/L (ref 39–117)
BUN: 12 mg/dL (ref 6–23)
CALCIUM: 9.7 mg/dL (ref 8.4–10.5)
CO2: 25 meq/L (ref 19–32)
Chloride: 105 mEq/L (ref 96–112)
Creat: 0.84 mg/dL (ref 0.50–1.10)
GLUCOSE: 91 mg/dL (ref 70–99)
Potassium: 4 mEq/L (ref 3.5–5.3)
SODIUM: 139 meq/L (ref 135–145)
TOTAL PROTEIN: 7 g/dL (ref 6.0–8.3)
Total Bilirubin: 0.3 mg/dL (ref 0.2–1.2)

## 2014-03-05 LAB — LIPID PANEL
CHOL/HDL RATIO: 4.6 ratio
CHOLESTEROL: 203 mg/dL — AB (ref 0–200)
HDL: 44 mg/dL (ref >39)
LDL Cholesterol: 139 mg/dL — ABNORMAL HIGH (ref 0–99)
Triglycerides: 99 mg/dL (ref <150)
VLDL: 20 mg/dL (ref 0–40)

## 2014-03-05 LAB — URINALYSIS W MICROSCOPIC + REFLEX CULTURE
BILIRUBIN URINE: NEGATIVE
Bacteria, UA: NONE SEEN
Casts: NONE SEEN
Crystals: NONE SEEN
GLUCOSE, UA: NEGATIVE mg/dL
Hgb urine dipstick: NEGATIVE
KETONES UR: NEGATIVE mg/dL
LEUKOCYTES UA: NEGATIVE
Nitrite: NEGATIVE
PH: 7.5 (ref 5.0–8.0)
Protein, ur: NEGATIVE mg/dL
SQUAMOUS EPITHELIAL / LPF: NONE SEEN
Specific Gravity, Urine: 1.026 (ref 1.005–1.030)
Urobilinogen, UA: 0.2 mg/dL (ref 0.0–1.0)

## 2014-03-07 LAB — CYTOLOGY - PAP

## 2015-07-20 ENCOUNTER — Encounter: Payer: BC Managed Care – PPO | Admitting: Gynecology

## 2015-07-29 ENCOUNTER — Encounter: Payer: BC Managed Care – PPO | Admitting: Gynecology

## 2015-09-01 ENCOUNTER — Telehealth: Payer: Self-pay | Admitting: Gynecology

## 2015-09-01 ENCOUNTER — Ambulatory Visit (INDEPENDENT_AMBULATORY_CARE_PROVIDER_SITE_OTHER): Payer: BC Managed Care – PPO | Admitting: Gynecology

## 2015-09-01 ENCOUNTER — Encounter: Payer: Self-pay | Admitting: Gynecology

## 2015-09-01 VITALS — BP 124/78 | Ht 69.0 in | Wt 240.0 lb

## 2015-09-01 DIAGNOSIS — Z13220 Encounter for screening for lipoid disorders: Secondary | ICD-10-CM

## 2015-09-01 DIAGNOSIS — Z803 Family history of malignant neoplasm of breast: Secondary | ICD-10-CM

## 2015-09-01 DIAGNOSIS — Z01419 Encounter for gynecological examination (general) (routine) without abnormal findings: Secondary | ICD-10-CM

## 2015-09-01 HISTORY — PX: OTHER SURGICAL HISTORY: SHX169

## 2015-09-01 NOTE — Patient Instructions (Signed)
Call if you want to pursue genetic counseling in reference to your family history of breast cancer.   Call to Schedule your mammogram  Facilities in [place]: 1)  [facility]. [address] [place] [address] [place] Phone: [phone] 2)  Dr. Isaiah Blakes at [facility] [address] Phone: [phone]     Mammogram A mammogram is an X-ray test to find changes in a woman's breast. You should get a mammogram if:  You are 5 years of age or older  You have risk factors.   Your doctor recommends that you have one.  BEFORE THE TEST  Do not schedule the test the week before your period, especially if your breasts are sore during this time.  On the day of your mammogram:  Wash your breasts and armpits well. After washing, do not put on any deodorant or talcum powder on until after your test.   Eat and drink as you usually do.   Take your medicines as usual.   If you are diabetic and take insulin, make sure you:   Eat before coming for your test.   Take your insulin as usual.   If you cannot keep your appointment, call before the appointment to cancel. Schedule another appointment.  TEST  You will need to undress from the waist up. You will put on a hospital gown.   Your breast will be put on the mammogram machine, and it will press firmly on your breast with a piece of plastic called a compression paddle. This will make your breast flatter so that the machine can X-ray all parts of your breast.   Both breasts will be X-rayed. Each breast will be X-rayed from above and from the side. An X-ray might need to be taken again if the picture is not good enough.   The mammogram will last about 15 to 30 minutes.  AFTER THE TEST Finding out the results of your test Ask when your test results will be ready. Make sure you get your test results.  Document Released: 04/15/2008 Document Revised: 01/06/2011 Document Reviewed:  04/15/2008 [facility] Patient Information 2012 [place].

## 2015-09-01 NOTE — Telephone Encounter (Signed)
09/01/15-I LM VM for pt to advise her that her [facility] will cover the replacement of her Mirena for contraception with her $40 copay. Per [id number]

## 2015-09-01 NOTE — Progress Notes (Signed)
    Sylvia Bell [birth year] [id number]        40 y.o.  [id number]  for annual exam.  Several issues noted below.  Past medical history,surgical history, problem list, medications, allergies, family history and social history were all reviewed and documented as reviewed in the EPIC chart.  ROS:  Performed with pertinent positives and negatives included in the history, assessment and plan.   Additional significant findings :  None   Exam: Caryn Bee assistant Vitals:   09/01/15 1543  BP: 124/78  Weight: 240 lb (108.9 kg)  Height: 5\' 9"  (1.753 m)   General appearance:  Normal affect, orientation and appearance. Skin: Grossly normal HEENT: Without gross lesions.  No cervical or supraclavicular adenopathy. Thyroid normal.  Lungs:  Clear without wheezing, rales or rhonchi Cardiac: RR, without RMG Abdominal:  Soft, nontender, without masses, guarding, rebound, organomegaly or hernia Breasts:  Examined lying and sitting without masses, retractions, discharge or axillary adenopathy. Pelvic:  Ext/BUS/Vagina normal  Cervix normal. IUD string not visualized  Uterus anteverted, normal size, shape and contour, midline and mobile nontender   Adnexa without masses or tenderness    Anus and perineum normal   Rectovaginal normal sphincter tone without palpated masses or tenderness.    Assessment/Plan:  40 y.o. [id number] female for annual exam without menses, Mirena IUD.   1. Mirena IUD 08/2010. Doing well. Has appointment scheduled to have it replaced and she will follow up for this. 2. Family history breast cancer in mother, premenopausal and maternal grandmother. I discussed genetic counseling and genetic testing in the past. Her mother apparently refuses to do so. Recommended patient schedule an appointment with a genetic counselor to discuss her family history and consider genetic testing. We discussed the various scenarios as far as what she would do if she was positive. At this point the  patient declines referral for genetic counseling but will call if she changes her mind. Strongly recommended that she schedule a baseline mammogram now and she agrees to do so. SBE monthly reviewed. 3. Continues to smoke despite recommendations otherwise. 4. Pap smear/HPV 2016. No Pap smear done today. No history of significant abnormal Pap smears previously. 5. Health maintenance. Baseline CBC, CMP, lipid profile, urinalysis ordered. Follow up for IUD replacement otherwise annual exam in one year   Anastasio Auerbach MD, 4:18 PM 09/01/2015

## 2015-09-02 ENCOUNTER — Other Ambulatory Visit: Payer: Self-pay | Admitting: Gynecology

## 2015-09-02 DIAGNOSIS — D72829 Elevated white blood cell count, unspecified: Secondary | ICD-10-CM

## 2015-09-02 DIAGNOSIS — E7800 Pure hypercholesterolemia, unspecified: Secondary | ICD-10-CM

## 2015-09-02 LAB — COMPREHENSIVE METABOLIC PANEL
ALBUMIN: 4.3 g/dL (ref 3.6–5.1)
ALK PHOS: 59 U/L (ref 33–115)
ALT: 16 U/L (ref 6–29)
AST: 14 U/L (ref 10–30)
BUN: 10 mg/dL (ref 7–25)
CALCIUM: 9.4 mg/dL (ref 8.6–10.2)
CO2: 20 mmol/L (ref 20–31)
Chloride: 105 mmol/L (ref 98–110)
Creat: 0.91 mg/dL (ref 0.50–1.10)
Glucose, Bld: 87 mg/dL (ref 65–99)
POTASSIUM: 3.8 mmol/L (ref 3.5–5.3)
Sodium: 138 mmol/L (ref 135–146)
TOTAL PROTEIN: 7 g/dL (ref 6.1–8.1)
Total Bilirubin: 0.4 mg/dL (ref 0.2–1.2)

## 2015-09-02 LAB — CBC WITH DIFFERENTIAL/PLATELET
Basophils Absolute: 0 cells/uL (ref 0–200)
Basophils Relative: 0 %
Eosinophils Absolute: 129 cells/uL (ref 15–500)
Eosinophils Relative: 1 %
HCT: 44.8 % (ref 35.0–45.0)
HEMOGLOBIN: 14.9 g/dL (ref 11.7–15.5)
LYMPHS PCT: 22 %
Lymphs Abs: 2838 cells/uL (ref 850–3900)
MCH: 28.7 pg (ref 27.0–33.0)
MCHC: 33.3 g/dL (ref 32.0–36.0)
MCV: 86.3 fL (ref 80.0–100.0)
MONO ABS: 645 {cells}/uL (ref 200–950)
MPV: 11.2 fL (ref 7.5–12.5)
Monocytes Relative: 5 %
Neutro Abs: 9288 cells/uL — ABNORMAL HIGH (ref 1500–7800)
Neutrophils Relative %: 72 %
Platelets: 196 10*3/uL (ref 140–400)
RBC: 5.19 MIL/uL — ABNORMAL HIGH (ref 3.80–5.10)
RDW: 14 % (ref 11.0–15.0)
WBC: 12.9 10*3/uL — ABNORMAL HIGH (ref 3.8–10.8)

## 2015-09-02 LAB — URINALYSIS W MICROSCOPIC + REFLEX CULTURE
BILIRUBIN URINE: NEGATIVE
Bacteria, UA: NONE SEEN [HPF]
CRYSTALS: NONE SEEN [HPF]
Casts: NONE SEEN [LPF]
Glucose, UA: NEGATIVE
KETONES UR: NEGATIVE
LEUKOCYTES UA: NEGATIVE
NITRITE: NEGATIVE
PH: 6.5 (ref 5.0–8.0)
Protein, ur: NEGATIVE
SPECIFIC GRAVITY, URINE: 1.008 (ref 1.001–1.035)
Squamous Epithelial / LPF: NONE SEEN [HPF] (ref <=5)
WBC UA: NONE SEEN WBC/HPF (ref <=5)
Yeast: NONE SEEN [HPF]

## 2015-09-02 LAB — LIPID PANEL
CHOLESTEROL: 198 mg/dL (ref 125–200)
HDL: 41 mg/dL — AB (ref >=46)
LDL Cholesterol: 130 mg/dL — ABNORMAL HIGH (ref <130)
Total CHOL/HDL Ratio: 4.8 Ratio (ref <=5.0)
Triglycerides: 134 mg/dL (ref <150)
VLDL: 27 mg/dL (ref <30)

## 2015-09-04 LAB — URINE CULTURE

## 2015-09-10 ENCOUNTER — Ambulatory Visit (INDEPENDENT_AMBULATORY_CARE_PROVIDER_SITE_OTHER): Payer: BC Managed Care – PPO | Admitting: Gynecology

## 2015-09-10 ENCOUNTER — Encounter: Payer: Self-pay | Admitting: Gynecology

## 2015-09-10 VITALS — BP 120/76

## 2015-09-10 DIAGNOSIS — Z30433 Encounter for removal and reinsertion of intrauterine contraceptive device: Secondary | ICD-10-CM | POA: Diagnosis not present

## 2015-09-10 NOTE — Patient Instructions (Signed)
Intrauterine Device Insertion Most often, an intrauterine device (IUD) is inserted into the uterus to prevent pregnancy. There are 2 types of IUDs available:  Copper IUD--This type of IUD creates an environment that is not favorable to sperm survival. The mechanism of action of the copper IUD is not known for certain. It can stay in place for 10 years.  Hormone IUD--This type of IUD contains the hormone progestin (synthetic progesterone). The progestin thickens the cervical mucus and prevents sperm from entering the uterus, and it also thins the uterine lining. There is no evidence that the hormone IUD prevents implantation. One hormone IUD can stay in place for up to 5 years, and a different hormone IUD can stay in place for up to 3 years. An IUD is the most cost-effective birth control if left in place for the full duration. It may be removed at any time. LET YOUR HEALTH CARE PROVIDER KNOW ABOUT:  Any allergies you have.  All medicines you are taking, including vitamins, herbs, eye drops, creams, and over-the-counter medicines.  Previous problems you or members of your family have had with the use of anesthetics.  Any blood disorders you have.  Previous surgeries you have had.  Possibility of pregnancy.  Medical conditions you have. RISKS AND COMPLICATIONS  Generally, intrauterine device insertion is a safe procedure. However, as with any procedure, complications can occur. Possible complications include:  Accidental puncture (perforation) of the uterus.  Accidental placement of the IUD either in the muscle layer of the uterus (myometrium) or outside the uterus. If this happens, the IUD can be found essentially floating around the bowels and must be taken out surgically.  The IUD may fall out of the uterus (expulsion). This is more common in women who have recently had a child.   Pregnancy in the fallopian tube (ectopic).  Pelvic inflammatory disease (PID), which is infection of  the uterus and fallopian tubes. The risk of PID is slightly increased in the first 20 days after the IUD is placed, but the overall risk is still very low. BEFORE THE PROCEDURE  Schedule the IUD insertion for when you will have your menstrual period or right after, to make sure you are not pregnant. Placement of the IUD is better tolerated shortly after a menstrual cycle.  You may need to take tests or be examined to make sure you are not pregnant.  You may be required to take a pregnancy test.  You may be required to get checked for sexually transmitted infections (STIs) prior to placement. Placing an IUD in someone who has an infection can make the infection worse.  You may be given a pain reliever to take 1 or 2 hours before the procedure.  An exam will be performed to determine the size and position of your uterus.  Ask your health care provider about changing or stopping your regular medicines. PROCEDURE   A tool (speculum) is placed in the vagina. This allows your health care provider to see the lower part of the uterus (cervix).  The cervix is prepped with a medicine that lowers the risk of infection.  You may be given a medicine to numb each side of the cervix (intracervical or paracervical block). This is used to block and control any discomfort with insertion.  A tool (uterine sound) is inserted into the uterus to determine the length of the uterine cavity and the direction the uterus may be tilted.  A slim instrument (IUD inserter) is inserted through the cervical   canal and into your uterus.  The IUD is placed in the uterine cavity and the insertion device is removed.  The nylon string that is attached to the IUD and used for eventual IUD removal is trimmed. It is trimmed so that it lays high in the vagina, just outside the cervix. AFTER THE PROCEDURE  You may have bleeding after the procedure. This is normal. It varies from light spotting for a few days to menstrual-like  bleeding.  You may have mild cramping.   This information is not intended to replace advice given to you by your health care provider. Make sure you discuss any questions you have with your health care provider.   Document Released: 09/15/2010 Document Revised: 11/07/2012 Document Reviewed: 07/08/2012 Elsevier Interactive Patient Education 2016 Elsevier Inc.  

## 2015-09-10 NOTE — Progress Notes (Signed)
    Sylvia Bell 26-Feb-1975 [id number]        40 y.o.  [id number]  presents for Mirena IUD replacement. She has read through the booklet, has no contraindications and signed the consent form.  I reviewed the removal and insertional process with her as well as the risks to include infection, either immediate or long-term, uterine perforation or migration requiring surgery to remove, other complications such as pain, hormonal side effects, infertility and possibility of failure with subsequent pregnancy.   Exam with Caryn Bee assistant Vitals:   09/10/15 0835  BP: 120/76    Pelvic: External BUS vagina normal. Cervix normal. IUD string not visualized. Uterus anteverted normal size shape contour midline mobile nontender. Adnexa without masses or tenderness.  Procedure: The cervix was cleansed with Betadine, anterior lip grasped with a single-tooth tenaculum, the cervix was gently dilated with a disposable dilator and using the [facility] forcep the IUD string was grasped within the endocervical canal and the old Mirena IUD was removed, shown to the patient and discarded.  The uterus was sounded and a new Mirena IUD was placed according to manufacturer's recommendations without difficulty. The strings were trimmed. The patient tolerated well and will follow up in one month for a postinsertional check.  Lot number:  [id number]    Anastasio Auerbach MD, 8:55 AM 09/10/2015

## 2015-10-08 ENCOUNTER — Ambulatory Visit: Payer: BC Managed Care – PPO | Admitting: Gynecology

## 2015-11-24 ENCOUNTER — Encounter: Payer: Self-pay | Admitting: Gynecology

## 2016-09-01 ENCOUNTER — Encounter: Payer: BC Managed Care – PPO | Admitting: Gynecology

## 2018-10-23 ENCOUNTER — Encounter: Payer: Self-pay | Admitting: Gynecology

## 2019-06-19 ENCOUNTER — Telehealth: Payer: Self-pay | Admitting: Family Medicine

## 2019-06-19 NOTE — Telephone Encounter (Signed)
Pts is calling in stating that one of her friends (ms [email or web address]) has asked if you would accept them as newpts.  They are aware that you are not taking any newpt.  Will you accept them as newpts?

## 2019-06-19 NOTE — Telephone Encounter (Signed)
Yes I can see them thanks

## 2019-06-20 NOTE — Telephone Encounter (Signed)
Pt has been called and given the below msg.  Lmom for pt to call the office to get scheduled as a newpt.

## 2019-06-24 NOTE — Telephone Encounter (Signed)
Called pt to see if she is ready to schedule an appointment.

## 2019-06-24 NOTE — Telephone Encounter (Signed)
Pt called back and was scheduled.

## 2019-07-03 ENCOUNTER — Other Ambulatory Visit: Payer: Self-pay

## 2019-07-03 ENCOUNTER — Ambulatory Visit (INDEPENDENT_AMBULATORY_CARE_PROVIDER_SITE_OTHER): Payer: BC Managed Care – PPO | Admitting: Family Medicine

## 2019-07-03 ENCOUNTER — Encounter: Payer: Self-pay | Admitting: Family Medicine

## 2019-07-03 VITALS — BP 124/64 | HR 107 | Temp 97.2°F | Wt 246.8 lb

## 2019-07-03 DIAGNOSIS — G43809 Other migraine, not intractable, without status migrainosus: Secondary | ICD-10-CM

## 2019-07-03 DIAGNOSIS — E785 Hyperlipidemia, unspecified: Secondary | ICD-10-CM | POA: Diagnosis not present

## 2019-07-03 DIAGNOSIS — F900 Attention-deficit hyperactivity disorder, predominantly inattentive type: Secondary | ICD-10-CM | POA: Diagnosis not present

## 2019-07-03 DIAGNOSIS — F418 Other specified anxiety disorders: Secondary | ICD-10-CM

## 2019-07-03 DIAGNOSIS — G43909 Migraine, unspecified, not intractable, without status migrainosus: Secondary | ICD-10-CM | POA: Insufficient documentation

## 2019-07-03 MED ORDER — AMPHETAMINE-DEXTROAMPHETAMINE 10 MG PO TABS: 10.0000 mg | ORAL_TABLET | Freq: Two times a day (BID) | ORAL | 0 refills | Status: DC

## 2019-07-03 MED ORDER — VARENICLINE TARTRATE 1 MG PO TABS: 1.0000 mg | ORAL_TABLET | Freq: Two times a day (BID) | ORAL | 0 refills | Status: DC

## 2019-07-03 MED ORDER — VENLAFAXINE HCL ER 150 MG PO CP24: 150.0000 mg | ORAL_CAPSULE | Freq: Every day | ORAL | 5 refills | Status: DC

## 2019-07-03 NOTE — Progress Notes (Signed)
° °  Subjective:    Patient ID: Sylvia Bell, female    DOB: [birth year], 44 y.o.   MRN: [id number]  HPI Here to establish with [place] after transferring from the care of Jannette Spanner PA at [facility]. She has several issues to discuss today. First she has been treated for depression and anxiety for years and she has been on several medications. For the past 2 years she has been taking Lexapro. This helped at first but now she says it does not do much for her. She has a lot of stress in her life, with being the divorced mother of her teenage boys and having a stressful job. 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 She mentions frequent episodes of palpitations where her heart feels like it beats very fast. These spells occur several times a week and they are quite brief, lasting only 5-10 seconds at a time. She sometimes feels SOB with them but there is no chest pain. She never has to stop what she is doing or to sit down. She has been trying to improve her health and over the past year she has lost over 50 lbs and she has quit smoking. She admits to using a lot of caffeine daily, including 2-3 cups of coffee and a large diet soda. Also she has been treated for years for ADHD, and this is well controlled by taking Adderall immediate release 10 mg BID.    Review of Systems  Constitutional: Negative.   Respiratory: Negative.   Cardiovascular: Positive for palpitations. Negative for chest pain and leg swelling.  Psychiatric/Behavioral: Positive for decreased concentration and dysphoric mood. Negative for agitation, behavioral problems, confusion, hallucinations, self-injury, sleep disturbance and suicidal ideas. The patient is nervous/anxious.        Objective:   Physical Exam Constitutional:      Appearance: She is obese. She is not ill-appearing.  Cardiovascular:     Rate and Rhythm: Normal rate and regular rhythm.     Pulses: Normal pulses.     Heart sounds: Normal heart sounds.  Pulmonary:       Effort: Pulmonary effort is normal.     Breath sounds: Normal breath sounds.  Neurological:     General: No focal deficit present.     Mental Status: She is alert and oriented to person, place, and time.  Psychiatric:        Mood and Affect: Mood normal.        Behavior: Behavior normal.        Thought Content: Thought content normal.        Judgment: Judgment normal.           Assessment & Plan:  Her ADHD is stable so we refilled the Adderall. Her depression and anxiety are not stable, so we will stop Lexapro and will start her on Effexor XR 150 mg daily. Recheck in one month. We will get her old records.  Alysia Penna, MD

## 2019-07-22 ENCOUNTER — Encounter: Payer: Self-pay | Admitting: Family Medicine

## 2019-07-22 DIAGNOSIS — E785 Hyperlipidemia, unspecified: Secondary | ICD-10-CM

## 2019-07-23 MED ORDER — AMPHETAMINE-DEXTROAMPHETAMINE 10 MG PO TABS: 10.0000 mg | ORAL_TABLET | Freq: Two times a day (BID) | ORAL | 0 refills | Status: DC

## 2019-07-23 MED ORDER — VENLAFAXINE HCL ER 150 MG PO CP24
150.0000 mg | ORAL_CAPSULE | Freq: Every day | ORAL | 5 refills | Status: DC
Start: 2019-07-23 — End: 2020-02-24

## 2019-07-23 NOTE — Telephone Encounter (Signed)
I put in the orders, she just needs a lab appt

## 2019-07-23 NOTE — Telephone Encounter (Signed)
I sent them in AGAIN

## 2019-09-30 ENCOUNTER — Other Ambulatory Visit: Payer: Self-pay | Admitting: Family Medicine

## 2019-09-30 NOTE — Telephone Encounter (Signed)
Last filled 09/22/2019 Last OV 07/03/2019  Ok to fill?

## 2019-09-30 NOTE — Telephone Encounter (Signed)
Message routed to PCP CMA  

## 2019-10-01 MED ORDER — AMPHETAMINE-DEXTROAMPHETAMINE 10 MG PO TABS: 10.0000 mg | ORAL_TABLET | Freq: Two times a day (BID) | ORAL | 0 refills | Status: DC

## 2019-10-01 NOTE — Telephone Encounter (Signed)
Done

## 2019-11-13 ENCOUNTER — Other Ambulatory Visit: Payer: Self-pay | Admitting: Family Medicine

## 2019-11-13 MED ORDER — AMPHETAMINE-DEXTROAMPHETAMINE 10 MG PO TABS: 10.0000 mg | ORAL_TABLET | Freq: Two times a day (BID) | ORAL | 0 refills | Status: DC

## 2019-11-13 NOTE — Telephone Encounter (Signed)
Done

## 2019-11-13 NOTE — Telephone Encounter (Signed)
Pt is calling in stating the she needs a refill on Rx amphetamine-dextroamphetamine (ADDERALL) 10 MG and is out of the medication.  [facility]

## 2019-11-18 ENCOUNTER — Ambulatory Visit: Payer: BC Managed Care – PPO | Admitting: Nurse Practitioner

## 2019-11-18 ENCOUNTER — Other Ambulatory Visit: Payer: Self-pay

## 2019-11-18 ENCOUNTER — Encounter: Payer: Self-pay | Admitting: Nurse Practitioner

## 2019-11-18 VITALS — BP 134/84

## 2019-11-18 DIAGNOSIS — B9689 Other specified bacterial agents as the cause of diseases classified elsewhere: Secondary | ICD-10-CM

## 2019-11-18 DIAGNOSIS — N898 Other specified noninflammatory disorders of vagina: Secondary | ICD-10-CM | POA: Diagnosis not present

## 2019-11-18 DIAGNOSIS — N760 Acute vaginitis: Secondary | ICD-10-CM | POA: Diagnosis not present

## 2019-11-18 DIAGNOSIS — Z113 Encounter for screening for infections with a predominantly sexual mode of transmission: Secondary | ICD-10-CM | POA: Diagnosis not present

## 2019-11-18 LAB — WET PREP FOR TRICH, YEAST, CLUE

## 2019-11-18 MED ORDER — METRONIDAZOLE 500 MG PO TABS: 500.0000 mg | ORAL_TABLET | Freq: Two times a day (BID) | ORAL | 0 refills | Status: AC

## 2019-11-18 NOTE — Patient Instructions (Signed)
Bacterial Vaginosis  Bacterial vaginosis is a vaginal infection that occurs when the normal balance of bacteria in the vagina is disrupted. It results from an overgrowth of certain bacteria. This is the most common vaginal infection among women ages 15-44. Because bacterial vaginosis increases your risk for STIs (sexually transmitted infections), getting treated can help reduce your risk for chlamydia, gonorrhea, herpes, and HIV (human immunodeficiency virus). Treatment is also important for preventing complications in pregnant women, because this condition can cause an early (premature) delivery. What are the causes? This condition is caused by an increase in harmful bacteria that are normally present in small amounts in the vagina. However, the reason that the condition develops is not fully understood. What increases the risk? The following factors may make you more likely to develop this condition:  Having a new sexual partner or multiple sexual partners.  Having unprotected sex.  Douching.  Having an intrauterine device (IUD).  Smoking.  Drug and alcohol abuse.  Taking certain antibiotic medicines.  Being pregnant. You cannot get bacterial vaginosis from toilet seats, bedding, swimming pools, or contact with objects around you. What are the signs or symptoms? Symptoms of this condition include:  Grey or white vaginal discharge. The discharge can also be watery or foamy.  A fish-like odor with discharge, especially after sexual intercourse or during menstruation.  Itching in and around the vagina.  Burning or pain with urination. Some women with bacterial vaginosis have no signs or symptoms. How is this diagnosed? This condition is diagnosed based on:  Your medical history.  A physical exam of the vagina.  Testing a sample of vaginal fluid under a microscope to look for a large amount of bad bacteria or abnormal cells. Your health care provider may use a cotton swab or  a small wooden spatula to collect the sample. How is this treated? This condition is treated with antibiotics. These may be given as a pill, a vaginal cream, or a medicine that is put into the vagina (suppository). If the condition comes back after treatment, a second round of antibiotics may be needed. Follow these instructions at home: Medicines  Take over-the-counter and prescription medicines only as told by your health care provider.  Take or use your antibiotic as told by your health care provider. Do not stop taking or using the antibiotic even if you start to feel better. General instructions  If you have a female sexual partner, tell her that you have a vaginal infection. She should see her health care provider and be treated if she has symptoms. If you have a female sexual partner, he does not need treatment.  During treatment: ? Avoid sexual activity until you finish treatment. ? Do not douche. ? Avoid alcohol as directed by your health care provider. ? Avoid breastfeeding as directed by your health care provider.  Drink enough water and fluids to keep your urine clear or pale yellow.  Keep the area around your vagina and rectum clean. ? Wash the area daily with warm water. ? Wipe yourself from front to back after using the toilet.  Keep all follow-up visits as told by your health care provider. This is important. How is this prevented?  Do not douche.  Wash the outside of your vagina with warm water only.  Use protection when having sex. This includes latex condoms and dental dams.  Limit how many sexual partners you have. To help prevent bacterial vaginosis, it is best to have sex with just one partner (  monogamous).  Make sure you and your sexual partner are tested for STIs.  Wear cotton or cotton-lined underwear.  Avoid wearing tight pants and pantyhose, especially during summer.  Limit the amount of alcohol that you drink.  Do not use any products that contain  nicotine or tobacco, such as cigarettes and e-cigarettes. If you need help quitting, ask your health care provider.  Do not use illegal drugs. Where to find more information  Centers for Disease Control and Prevention: www.cdc.gov/std  American Sexual Health Association (ASHA): www.ashastd.org  U.S. Department of Health and Human Services, Office on Women's Health: www.womenshealth.gov/ or https://www.womenshealth.gov/a-z-topics/bacterial-vaginosis Contact a health care provider if:  Your symptoms do not improve, even after treatment.  You have more discharge or pain when urinating.  You have a fever.  You have pain in your abdomen.  You have pain during sex.  You have vaginal bleeding between periods. Summary  Bacterial vaginosis is a vaginal infection that occurs when the normal balance of bacteria in the vagina is disrupted.  Because bacterial vaginosis increases your risk for STIs (sexually transmitted infections), getting treated can help reduce your risk for chlamydia, gonorrhea, herpes, and HIV (human immunodeficiency virus). Treatment is also important for preventing complications in pregnant women, because the condition can cause an early (premature) delivery.  This condition is treated with antibiotic medicines. These may be given as a pill, a vaginal cream, or a medicine that is put into the vagina (suppository). This information is not intended to replace advice given to you by your health care provider. Make sure you discuss any questions you have with your health care provider. Document Revised: 12/30/2016 Document Reviewed: 10/03/2015 Elsevier Patient Education  2020 Elsevier Inc.  

## 2019-11-18 NOTE — Progress Notes (Signed)
   Acute Office Visit  Subjective:    Patient ID: Sylvia Bell, female    DOB: [birth year], 44 y.o.   MRN: [id number]   HPI 44 y.o. presents today for pain with intercourse and vaginal irritation/itching.  She has a new sexual partner and prior to this was not sexually active for a few years.  The pain feels deep and sharp and hurts only with deep penetration.  Denies postcoital bleeding or cramping.  Mirena IUD.  Itching began 1 week ago and she took 1 Diflucan that she had at home with minimal relief.   Review of Systems  Constitutional: Negative.   Gastrointestinal: Negative.   Genitourinary: Positive for dyspareunia and vaginal discharge.       Vaginal itching/irritation       Objective:    Physical Exam Constitutional:      Appearance: Normal appearance.  Abdominal:     Tenderness: There is no abdominal tenderness.  Genitourinary:    General: Normal vulva.     Vagina: Vaginal discharge and erythema present.     Cervix: Normal.     Uterus: Normal.      Comments: IUD string visible in os    BP 134/84  Wt Readings from Last 3 Encounters:  07/03/19 246 lb 12.8 oz (111.9 kg)  09/01/15 240 lb (108.9 kg)  03/04/14 235 lb (106.6 kg)   Wet prep + clue cells     Assessment & Plan:   Problem List Items Addressed This Visit    None    Visit Diagnoses    Bacterial vaginosis    -  Primary   Relevant Medications   metroNIDAZOLE (FLAGYL) 500 MG tablet   Screen for STD (sexually transmitted disease)       Relevant Orders   C. trachomatis/N. gonorrhoeae RNA   HIV Antibody (routine testing w rflx)   RPR   Vaginal itching       Relevant Orders   WET PREP FOR [place], YEAST, CLUE     Plan: Wet prep and exam consistent with bacterial vaginosis.  Flagyl 500 mg twice a day for 7 days with instructions to complete full course of antibiotic and avoid alcohol.  CT/NG, HIV, RPR.  Reassurance provided on correct position of IUD.  Recommend trying different positions to avoid  deepest penetration.  Return if symptoms worsen or do not improve.  She is agreeable to plan.     Tamela [facility], 3:25 PM 11/18/2019

## 2019-11-19 LAB — RPR: RPR Ser Ql: NONREACTIVE

## 2019-11-19 LAB — HIV ANTIBODY (ROUTINE TESTING W REFLEX): HIV 1&2 Ab, 4th Generation: NONREACTIVE

## 2019-11-19 LAB — C. TRACHOMATIS/N. GONORRHOEAE RNA
C. trachomatis RNA, TMA: NOT DETECTED
N. gonorrhoeae RNA, TMA: NOT DETECTED

## 2019-12-17 ENCOUNTER — Other Ambulatory Visit: Payer: Self-pay | Admitting: Family Medicine

## 2019-12-17 NOTE — Telephone Encounter (Signed)
Patient notified Via phone that there were refills at the pharmacy and they will get it ready for her.  No further questions. Dm/cma

## 2019-12-17 NOTE — Telephone Encounter (Signed)
Patient is calling and requesting a refill for amphetamine-dextroamphetamine (ADDERALL) 10 MG tablet [id number] [place] [place] - [address] AT [address]  Phone:  [phone] Fax:  [phone] CB is [phone]

## 2019-12-19 ENCOUNTER — Ambulatory Visit: Payer: BC Managed Care – PPO | Admitting: Nurse Practitioner

## 2019-12-19 NOTE — Progress Notes (Signed)
Key: [place] - PA Case ID: [id number] - Rx #: [id number] Need help? Call us at [phone] Status Sent to [facility] Amphetamine-Dextroamphetamine 10MG  tablets Form [employer] Electronic PA Form [phone] NCPDP) Original Claim Info [place] REQ-MD CALL [phone].DRUG REQUIRES PRIOR AUTHORIZATION

## 2020-02-23 ENCOUNTER — Other Ambulatory Visit: Payer: Self-pay | Admitting: Family Medicine

## 2020-04-03 ENCOUNTER — Other Ambulatory Visit: Payer: Self-pay | Admitting: Family Medicine

## 2020-04-03 ENCOUNTER — Telehealth: Payer: Self-pay | Admitting: Family Medicine

## 2020-04-03 MED ORDER — VENLAFAXINE HCL ER 150 MG PO CP24
150.0000 mg | ORAL_CAPSULE | Freq: Every day | ORAL | 3 refills | Status: DC
Start: 2020-04-03 — End: 2021-03-09

## 2020-04-03 MED ORDER — AMPHETAMINE-DEXTROAMPHETAMINE 10 MG PO TABS: 10.0000 mg | ORAL_TABLET | Freq: Two times a day (BID) | ORAL | 0 refills | Status: DC

## 2020-04-03 NOTE — Telephone Encounter (Signed)
Please advise 

## 2020-04-03 NOTE — Telephone Encounter (Signed)
Last Office visit for adderal was 07/03/2019 for 60 tablets.Last refill was 01/13/2020. Please advise.

## 2020-04-03 NOTE — Telephone Encounter (Signed)
Pt is calling in a refill on Rx venlafaxine XR (EFFEXOR-XR) 150 MG (out of this medication and is at the pharmacy now) and amphetamine-dextroamphetamine (ADDERALL) 10 MG  [facility] [address]

## 2020-04-03 NOTE — Telephone Encounter (Signed)
Done

## 2020-04-03 NOTE — Telephone Encounter (Signed)
Pt notified on MyChart

## 2020-05-22 ENCOUNTER — Telehealth: Payer: Self-pay | Admitting: Family Medicine

## 2020-05-22 NOTE — Telephone Encounter (Signed)
Patient is calling and requesting a refill for amphetamine-dextroamphetamine (ADDERALL) 10 MG tablet and venlafaxine XR (EFFEXOR-XR) 150 MG 24 hr capsule to [facility] to   [id number] [address] [place] [address] [place] [address]  Phone:  [phone] Fax:  [phone]  CB is [phone]

## 2020-05-22 NOTE — Telephone Encounter (Signed)
Spoke with the pt and informed her refills should be on file for both Rxs as the Rx for Adderall was sent on 3/4, 4/4 and refill available on 5/4 and refills were also sent for [facility] for #90 with 3 refills.  Patient agreed to contact the pharmacy and call back with any further questions.

## 2020-08-17 ENCOUNTER — Other Ambulatory Visit: Payer: Self-pay | Admitting: Family Medicine

## 2020-08-17 NOTE — Telephone Encounter (Signed)
Last office visit- 07/03/19 Last refill-06/03/20   No future office visit scheduled. Can this patient receive a refill?

## 2020-08-18 ENCOUNTER — Encounter: Payer: Self-pay | Admitting: Family Medicine

## 2020-08-18 ENCOUNTER — Other Ambulatory Visit: Payer: Self-pay | Admitting: Family Medicine

## 2020-08-19 ENCOUNTER — Encounter: Payer: Self-pay | Admitting: Family Medicine

## 2020-08-19 ENCOUNTER — Telehealth (INDEPENDENT_AMBULATORY_CARE_PROVIDER_SITE_OTHER): Payer: BC Managed Care – PPO | Admitting: Family Medicine

## 2020-08-19 DIAGNOSIS — F900 Attention-deficit hyperactivity disorder, predominantly inattentive type: Secondary | ICD-10-CM

## 2020-08-19 MED ORDER — AMPHETAMINE-DEXTROAMPHET ER 20 MG PO CP24: 20.0000 mg | ORAL_CAPSULE | ORAL | 0 refills | Status: DC

## 2020-08-19 NOTE — Progress Notes (Signed)
Subjective:    Patient ID: Sylvia Bell, female    DOB: 1975/08/15, 45 y.o.   MRN: [id number]  HPI Virtual Visit via Video Note  I connected with the patient on 08/19/20 at  2:30 PM EDT by a video enabled telemedicine application and verified that I am speaking with the correct person using two identifiers.  Location patient: home Location provider:work or home office Persons participating in the virtual visit: patient, provider  I discussed the limitations of evaluation and management by telemedicine and the availability of in person appointments. The patient expressed understanding and agreed to proceed.   HPI: Here to follow up on ADHD. Her Adderall has been helping her a lot, but she says she often feels a "crash" in the afternoon as if the medication is wearing off too quickly.    ROS: See pertinent positives and negatives per HPI.  Past Medical History:  Diagnosis Date   Allergy    Anxiety    Arthritis    Binge eating disorder    Chicken pox    Depression    Frequent headaches    Hyperlipidemia    Migraines    UTI (urinary tract infection)     Past Surgical History:  Procedure Laterality Date   CESAREAN SECTION  2001,2004   X2   IUD PLACEMENT  09/2015   MIRENA   TONSILLECTOMY  2011    Family History  Problem Relation Age of Onset   Breast cancer Mother 28       ONSET IN AGE 75'S-? DIAGNOSIS   Lupus Mother    Arthritis Mother    Cancer Mother    Hearing loss Mother    Hyperlipidemia Mother    Hypertension Father    Arthritis Father    Hyperlipidemia Father    Breast cancer Maternal Grandmother        Age unknown   Arthritis Maternal Grandmother    Cancer Maternal Grandmother    COPD Maternal Grandmother    Hyperlipidemia Maternal Grandmother    Hyperlipidemia Sister    Arthritis Maternal Grandfather    COPD Maternal Grandfather    Hyperlipidemia Maternal Grandfather    Hypertension Maternal Grandfather    Hearing loss Maternal Grandfather     Asthma Paternal Grandfather    Depression Paternal Grandfather    Heart disease Paternal Grandfather      Current Outpatient Medications:    [START ON 10/20/2020] amphetamine-dextroamphetamine (ADDERALL XR) 20 MG 24 hr capsule, Take 1 capsule (20 mg total) by mouth every morning., Disp: 30 capsule, Rfl: 0   calcium carbonate (CALCIUM 600) 600 MG TABS tablet, Take 600 mg by mouth 2 (two) times daily with a meal., Disp: , Rfl:    Cholecalciferol (VITAMIN D3) 50 MCG (2000 UT) capsule, Take 2,000 Units by mouth daily., Disp: , Rfl:    levonorgestrel (MIRENA) 20 MCG/24HR IUD, 1 each by Intrauterine route once.  , Disp: , Rfl:    Magnesium 250 MG TABS, Take 250 mg by mouth daily., Disp: , Rfl:    Omega-3 Fatty Acids (FISH OIL) 1200 MG CAPS, Take by mouth., Disp: , Rfl:    varenicline (CHANTIX CONTINUING MONTH PAK) 1 MG tablet, Take 1 tablet (1 mg total) by mouth 2 (two) times daily. (Patient not taking: Reported on 11/18/2019), Disp: 60 tablet, Rfl: 0   venlafaxine XR (EFFEXOR-XR) 150 MG 24 hr capsule, Take 1 capsule (150 mg total) by mouth daily with breakfast., Disp: 90 capsule, Rfl: 3  EXAM:  VITALS  per patient if applicable:  GENERAL: alert, oriented, appears well and in no acute distress  HEENT: atraumatic, conjunttiva clear, no obvious abnormalities on inspection of external nose and ears  NECK: normal movements of the head and neck  LUNGS: on inspection no signs of respiratory distress, breathing rate appears normal, no obvious gross SOB, gasping or wheezing  CV: no obvious cyanosis  MS: moves all visible extremities without noticeable abnormality  PSYCH/NEURO: pleasant and cooperative, no obvious depression or anxiety, speech and thought processing grossly intact  ASSESSMENT AND PLAN: For her ADHD, we agreed to stop the immediate release form of Adderall, and instead she will try Adderall XR 20 mg daily. She will set up a well exam here soon, and we will follow up on this at  that time.  Alysia Penna, MD  Discussed the following assessment and plan:  No diagnosis found.     I discussed the assessment and treatment plan with the patient. The patient was provided an opportunity to ask questions and all were answered. The patient agreed with the plan and demonstrated an understanding of the instructions.   The patient was advised to call back or seek an in-person evaluation if the symptoms worsen or if the condition fails to improve as anticipated.      Review of Systems     Objective:   Physical Exam        Assessment & Plan:

## 2020-09-09 ENCOUNTER — Encounter: Payer: Self-pay | Admitting: Family Medicine

## 2020-09-14 ENCOUNTER — Encounter: Payer: Self-pay | Admitting: Family Medicine

## 2020-09-16 ENCOUNTER — Telehealth: Payer: Self-pay

## 2020-09-16 NOTE — Telephone Encounter (Signed)
Prior authorization approved for generic Adderall XR '20mg'$  from 09/16/20 to 09/16/2021 approval number [id number].  Approval will be faxed to office and pharmacy.   Patient will be notified via my chart

## 2020-11-12 ENCOUNTER — Encounter: Payer: Self-pay | Admitting: Family Medicine

## 2020-11-12 MED ORDER — AMPHETAMINE-DEXTROAMPHETAMINE 20 MG PO TABS
20.0000 mg | ORAL_TABLET | Freq: Two times a day (BID) | ORAL | 0 refills | Status: DC
Start: 2020-11-12 — End: 2021-10-11

## 2020-11-12 NOTE — Telephone Encounter (Signed)
I understand. We will stop the Adderall XR and switch to immediate release Adderall, but we will increase this to TWICE a day. I sent in for Adderall 20 mg BID. Follow up in a few weeks

## 2020-11-13 ENCOUNTER — Encounter: Payer: Self-pay | Admitting: Nurse Practitioner

## 2020-11-13 ENCOUNTER — Other Ambulatory Visit: Payer: Self-pay

## 2020-11-13 ENCOUNTER — Telehealth: Payer: Self-pay | Admitting: *Deleted

## 2020-11-13 MED ORDER — FLUCONAZOLE 150 MG PO TABS: ORAL_TABLET | ORAL | 5 refills | Status: DC

## 2020-11-13 NOTE — Telephone Encounter (Signed)
Call in Diflucan 150 mg to take as needed, #2 with 5 rf

## 2020-11-13 NOTE — Telephone Encounter (Signed)
See telephone encounter dated 11/13/20.   Encounter closed.

## 2020-11-13 NOTE — Telephone Encounter (Signed)
Call placed to patient in f/u to MyChart message as seen below.   Last AEX 8/1/7  Left message requesting return call to office to further access symptoms and schedule OV for further evaluation.       [place], [place] (supporting Marny Lowenstein A, NP) 24 minutes ago (1:48 PM)   JS Can someone please call me in 2 pills of this? The generic? Flo something? I don't want to have to have this over the weekend. Ugh. Hate this mess. Thanks ya'll! Have a GREAT weekend!

## 2020-11-19 NOTE — Telephone Encounter (Signed)
Routing to [place] Triage for f/u.

## 2020-12-31 NOTE — Telephone Encounter (Signed)
Per review of Epic, diflucan prescribed by PCP on 11/13/20.   Encounter closed.

## 2021-03-09 ENCOUNTER — Other Ambulatory Visit: Payer: Self-pay | Admitting: Family Medicine

## 2021-03-09 NOTE — Telephone Encounter (Signed)
LOV was on 08/19/2020 Last refill was one on 04/03/2020 Please advise

## 2021-04-22 ENCOUNTER — Encounter: Payer: Self-pay | Admitting: Family Medicine

## 2021-10-07 ENCOUNTER — Encounter: Payer: Self-pay | Admitting: Family Medicine

## 2021-10-08 ENCOUNTER — Other Ambulatory Visit: Payer: Self-pay

## 2021-10-08 MED ORDER — VALACYCLOVIR HCL 1 G PO TABS: ORAL_TABLET | ORAL | 2 refills | Status: DC

## 2021-10-08 NOTE — Progress Notes (Signed)
V

## 2021-10-08 NOTE — Telephone Encounter (Signed)
Call in Valtrex 1000 mg to take BID as needed for fever blisters, #60 with 2 rf

## 2021-10-11 ENCOUNTER — Other Ambulatory Visit (HOSPITAL_COMMUNITY)
Admission: RE | Admit: 2021-10-11 | Discharge: 2021-10-11 | Disposition: A | Discharge status: Home / Self Care | Payer: BC Managed Care – PPO | Source: Ambulatory Visit | Attending: Nurse Practitioner | Admitting: Nurse Practitioner

## 2021-10-11 ENCOUNTER — Encounter: Payer: Self-pay | Admitting: Nurse Practitioner

## 2021-10-11 ENCOUNTER — Ambulatory Visit (INDEPENDENT_AMBULATORY_CARE_PROVIDER_SITE_OTHER): Payer: BC Managed Care – PPO | Admitting: Nurse Practitioner

## 2021-10-11 VITALS — BP 114/70 | HR 94 | Ht 68.75 in | Wt 263.0 lb

## 2021-10-11 DIAGNOSIS — Z1211 Encounter for screening for malignant neoplasm of colon: Secondary | ICD-10-CM

## 2021-10-11 DIAGNOSIS — Z01419 Encounter for gynecological examination (general) (routine) without abnormal findings: Secondary | ICD-10-CM | POA: Diagnosis present

## 2021-10-11 DIAGNOSIS — Z30431 Encounter for routine checking of intrauterine contraceptive device: Secondary | ICD-10-CM

## 2021-10-11 NOTE — Progress Notes (Signed)
   Sylvia Bell Jan 12, 1976 [id number]   History:  46 y.o. [id number] presents for annual exam. Mirena IUD 09/2015, Amenorrheic. Normal pap history. Mother (in her 39s) and MGM with history of breast cancer, mother and patient decline genetic testing.   Gynecologic History No LMP recorded. (Menstrual status: IUD).   Contraception/Family planning: IUD Sexually active: Yes  Health Maintenance Last Pap: 03/04/2014. Results were: Normal neg HPV Last mammogram: 11/24/2015. Results were: Normal Last colonoscopy: Never Last Dexa: Not indicated  Past medical history, past surgical history, family history and social history were all reviewed and documented in the EPIC chart. 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 [place], playing basketball. 53 yo son, just moved in with girlfriend. Mother and MGM with history of breast cancer.   ROS:  A ROS was performed and pertinent positives and negatives are included.  Exam:  Vitals:   10/11/21 1502  BP: 114/70  Pulse: 94  SpO2: 97%  Weight: 263 lb (119.3 kg)  Height: 5' 8.75" (1.746 m)   Body mass index is 39.12 kg/m.  General appearance:  Normal Thyroid:  Symmetrical, normal in size, without palpable masses or nodularity. Respiratory  Auscultation:  Clear without wheezing or rhonchi Cardiovascular  Auscultation:  Regular rate, without rubs, murmurs or gallops  Edema/varicosities:  Not grossly evident Abdominal  Soft,nontender, without masses, guarding or rebound.  Liver/spleen:  No organomegaly noted  Hernia:  None appreciated  Skin  Inspection:  Grossly normal Breasts: Examined lying and sitting.   Right: Without masses, retractions, nipple discharge or axillary adenopathy.   Left: Without masses, retractions, nipple discharge or axillary adenopathy. Genitourinary   Inguinal/mons:  Normal without inguinal adenopathy  External genitalia:  Normal appearing vulva with no masses, tenderness, or lesions  BUS/Urethra/Skene's glands:   Normal  Vagina:  Normal appearing with normal color and discharge, no lesions  Cervix:  Normal appearing without discharge or lesions  Uterus:  Difficult to palpate due to body habitus but no gross masses or tenderness  Adnexa/parametria:     Rt: Normal in size, without masses or tenderness.   Lt: Normal in size, without masses or tenderness.  Anus and perineum: Large, non-bleeding external hemorrhoids present  Digital rectal exam: Normal sphincter tone without palpated masses or tenderness  Patient informed chaperone available to be present for breast and pelvic exam. Patient has requested no chaperone to be present. Patient has been advised what will be completed during breast and pelvic exam.   Assessment/Plan:  46 y.o. [id number] for annual exam.   Well female exam with routine gynecological exam - Plan: Cytology - PAP( [facility]). Education provided on SBEs, importance of preventative screenings, current guidelines, high calcium diet, regular exercise, and multivitamin daily.  Labs with PCP.   Encounter for routine checking of intrauterine contraceptive device (IUD) 0 Mirena IUD 09/2015, amenorrheic. Aware of 8-year FDA approval.   Screening for colon cancer - Plan: Cologuard. Discussed colon cancer screening options. Average risk. Prefers Cologuard.   Screening for cervical cancer - Normal Pap history.  Will repeat at 5-year interval per guidelines.  Screening for breast cancer - Normal mammogram history, although way overdue. Information provided on local breast imaging centers and encouraged to schedule now.  Normal breast exam today. Mother (in her 79s) and MGM with history of breast cancer, mother and patient decline genetic testing.   Return in 1 year for annual.     Tamela Gammon DNP, 3:39 PM 10/11/2021

## 2021-10-15 LAB — CYTOLOGY - PAP
Adequacy: ABSENT
Comment: NEGATIVE
Diagnosis: NEGATIVE
High risk HPV: NEGATIVE

## 2021-10-18 ENCOUNTER — Other Ambulatory Visit: Payer: Self-pay | Admitting: Nurse Practitioner

## 2021-10-18 DIAGNOSIS — N760 Acute vaginitis: Secondary | ICD-10-CM

## 2021-10-18 MED ORDER — METRONIDAZOLE 0.75 % VA GEL: 1.0000 | Freq: Every day | VAGINAL | 0 refills | Status: AC

## 2021-10-25 ENCOUNTER — Ambulatory Visit (INDEPENDENT_AMBULATORY_CARE_PROVIDER_SITE_OTHER): Payer: BC Managed Care – PPO | Admitting: Family Medicine

## 2021-10-25 ENCOUNTER — Encounter: Payer: Self-pay | Admitting: Family Medicine

## 2021-10-25 VITALS — BP 110/80 | HR 75 | Temp 98.2°F | Ht 68.75 in | Wt 255.0 lb

## 2021-10-25 DIAGNOSIS — Z0000 Encounter for general adult medical examination without abnormal findings: Secondary | ICD-10-CM | POA: Diagnosis not present

## 2021-10-25 DIAGNOSIS — Z23 Encounter for immunization: Secondary | ICD-10-CM | POA: Diagnosis not present

## 2021-10-25 LAB — HEPATIC FUNCTION PANEL
ALT: 16 U/L (ref 0–35)
AST: 14 U/L (ref 0–37)
Albumin: 4.2 g/dL (ref 3.5–5.2)
Alkaline Phosphatase: 59 U/L (ref 39–117)
Bilirubin, Direct: 0.1 mg/dL (ref 0.0–0.3)
Total Bilirubin: 0.5 mg/dL (ref 0.2–1.2)
Total Protein: 7.5 g/dL (ref 6.0–8.3)

## 2021-10-25 LAB — CBC WITH DIFFERENTIAL/PLATELET
Basophils Absolute: 0 10*3/uL (ref 0.0–0.1)
Basophils Relative: 0.2 % (ref 0.0–3.0)
Eosinophils Absolute: 0.1 10*3/uL (ref 0.0–0.7)
Eosinophils Relative: 1.1 % (ref 0.0–5.0)
HCT: 42.2 % (ref 36.0–46.0)
Hemoglobin: 14.1 g/dL (ref 12.0–15.0)
Lymphocytes Relative: 22.3 % (ref 12.0–46.0)
Lymphs Abs: 2.3 10*3/uL (ref 0.7–4.0)
MCHC: 33.4 g/dL (ref 30.0–36.0)
MCV: 88.2 fl (ref 78.0–100.0)
Monocytes Absolute: 0.6 10*3/uL (ref 0.1–1.0)
Monocytes Relative: 6.1 % (ref 3.0–12.0)
Neutro Abs: 7.2 10*3/uL (ref 1.4–7.7)
Neutrophils Relative %: 70.3 % (ref 43.0–77.0)
Platelets: 232 10*3/uL (ref 150.0–400.0)
RBC: 4.78 Mil/uL (ref 3.87–5.11)
RDW: 13.6 % (ref 11.5–15.5)
WBC: 10.2 10*3/uL (ref 4.0–10.5)

## 2021-10-25 LAB — BASIC METABOLIC PANEL
BUN: 9 mg/dL (ref 6–23)
CO2: 28 mEq/L (ref 19–32)
Calcium: 9.2 mg/dL (ref 8.4–10.5)
Chloride: 103 mEq/L (ref 96–112)
Creatinine, Ser: 0.9 mg/dL (ref 0.40–1.20)
GFR: 76.41 mL/min (ref >60.00)
Glucose, Bld: 90 mg/dL (ref 70–99)
Potassium: 4.1 mEq/L (ref 3.5–5.1)
Sodium: 140 mEq/L (ref 135–145)

## 2021-10-25 LAB — LIPID PANEL
Cholesterol: 191 mg/dL (ref 0–200)
HDL: 42 mg/dL (ref >39.00)
LDL Cholesterol: 125 mg/dL — ABNORMAL HIGH (ref 0–99)
NonHDL: 149.29
Total CHOL/HDL Ratio: 5
Triglycerides: 122 mg/dL (ref 0.0–149.0)
VLDL: 24.4 mg/dL (ref 0.0–40.0)

## 2021-10-25 LAB — TSH: TSH: 1.35 u[IU]/mL (ref 0.35–5.50)

## 2021-10-25 MED ORDER — AMPHETAMINE-DEXTROAMPHETAMINE 10 MG PO TABS: 10.0000 mg | ORAL_TABLET | Freq: Every evening | ORAL | 0 refills | Status: DC

## 2021-10-25 MED ORDER — VENLAFAXINE HCL ER 75 MG PO CP24: 75.0000 mg | ORAL_CAPSULE | Freq: Every day | ORAL | 0 refills | Status: DC

## 2021-10-25 MED ORDER — AMPHETAMINE-DEXTROAMPHET ER 20 MG PO CP24: 20.0000 mg | ORAL_CAPSULE | ORAL | 0 refills | Status: DC

## 2021-10-25 NOTE — Progress Notes (Signed)
Subjective:    Patient ID: Sylvia Bell, female    DOB: [birth year], 46 y.o.   MRN: [id number]  HPI Here for a well exam. She feels fine physically. She says her depression and anxiety are stable. So she wants to wean off Effexor. She also wants to get back on her ADHD medication because she feels her life is very disorganized lately.    Review of Systems  Constitutional: Negative.   HENT: Negative.    Eyes: Negative.   Respiratory: Negative.    Cardiovascular: Negative.   Gastrointestinal: Negative.   Genitourinary:  Negative for decreased urine volume, difficulty urinating, dyspareunia, dysuria, enuresis, flank pain, frequency, hematuria, pelvic pain and urgency.  Musculoskeletal: Negative.   Skin: Negative.   Neurological: Negative.  Negative for headaches.  Psychiatric/Behavioral:  Positive for decreased concentration. Negative for confusion and dysphoric mood. The patient is not nervous/anxious.        Objective:   Physical Exam Constitutional:      General: She is not in acute distress.    Appearance: She is well-developed. She is obese.  HENT:     Head: Normocephalic and atraumatic.     Right Ear: External ear normal.     Left Ear: External ear normal.     Nose: Nose normal.     Mouth/Throat:     Pharynx: No oropharyngeal exudate.  Eyes:     General: No scleral icterus.    Conjunctiva/sclera: Conjunctivae normal.     Pupils: Pupils are equal, round, and reactive to light.  Neck:     Thyroid: No thyromegaly.     Vascular: No JVD.  Cardiovascular:     Rate and Rhythm: Normal rate and regular rhythm.     Heart sounds: Normal heart sounds. No murmur heard.    No friction rub. No gallop.  Pulmonary:     Effort: Pulmonary effort is normal. No respiratory distress.     Breath sounds: Normal breath sounds. No wheezing or rales.  Chest:     Chest wall: No tenderness.  Abdominal:     General: Bowel sounds are normal. There is no distension.     Palpations:  Abdomen is soft. There is no mass.     Tenderness: There is no abdominal tenderness. There is no guarding or rebound.  Musculoskeletal:        General: No tenderness. Normal range of motion.     Cervical back: Normal range of motion and neck supple.  Lymphadenopathy:     Cervical: No cervical adenopathy.  Skin:    General: Skin is warm and dry.     Findings: No erythema or rash.  Neurological:     Mental Status: She is alert and oriented to person, place, and time.     Cranial Nerves: No cranial nerve deficit.     Motor: No abnormal muscle tone.     Coordination: Coordination normal.     Deep Tendon Reflexes: Reflexes are normal and symmetric. Reflexes normal.  Psychiatric:        Mood and Affect: Mood normal.        Behavior: Behavior normal.        Thought Content: Thought content normal.        Judgment: Judgment normal.           Assessment & Plan:  Well exam. We discussed diet and exercise. Get fasting labs. We will wean off Effexor slowly by taking XR 75 mg daily. For ADHD she will try  Adderall XR 20 mg in the mornings and Adderall 10 mg in the evenings. Follow up in 3-4 weeks.  Alysia Penna, MD

## 2021-10-25 NOTE — Addendum Note (Signed)
Addended by: Wyvonne Lenz on: 10/25/2021 05:02 PM   Modules accepted: Orders

## 2021-10-26 LAB — HEMOGLOBIN A1C: Hgb A1c MFr Bld: 5.9 % (ref 4.6–6.5)

## 2021-11-24 ENCOUNTER — Other Ambulatory Visit: Payer: Self-pay | Admitting: Family Medicine

## 2021-11-28 ENCOUNTER — Other Ambulatory Visit: Payer: Self-pay | Admitting: Family Medicine

## 2021-12-07 ENCOUNTER — Encounter: Payer: Self-pay | Admitting: Family Medicine

## 2021-12-07 NOTE — Telephone Encounter (Signed)
She talking about the Venlafaxine. She can go ahead and stop it now. Call back if there are any problems

## 2021-12-11 ENCOUNTER — Other Ambulatory Visit: Payer: Self-pay | Admitting: Family Medicine

## 2021-12-12 ENCOUNTER — Other Ambulatory Visit: Payer: Self-pay | Admitting: Family Medicine

## 2021-12-13 NOTE — Telephone Encounter (Signed)
Pt LOV was on 10/25/21 Last refill done on 11/24/21 Please advise

## 2021-12-13 NOTE — Telephone Encounter (Signed)
Yes unfortunately it is common to have trouble getting off this medication. Hopefully things will get better any day now. If anything else changes let us know (we don't want to miss some unrelated issue going on)

## 2021-12-14 MED ORDER — AMPHETAMINE-DEXTROAMPHET ER 20 MG PO CP24: 20.0000 mg | ORAL_CAPSULE | ORAL | 0 refills | Status: DC

## 2021-12-14 MED ORDER — AMPHETAMINE-DEXTROAMPHETAMINE 10 MG PO TABS: 10.0000 mg | ORAL_TABLET | Freq: Every evening | ORAL | 0 refills | Status: DC

## 2021-12-14 NOTE — Telephone Encounter (Signed)
Done

## 2022-01-02 ENCOUNTER — Other Ambulatory Visit: Payer: Self-pay | Admitting: Family Medicine

## 2022-03-22 ENCOUNTER — Other Ambulatory Visit: Payer: Self-pay | Admitting: Family Medicine

## 2022-03-24 MED ORDER — AMPHETAMINE-DEXTROAMPHET ER 20 MG PO CP24: 20.0000 mg | ORAL_CAPSULE | ORAL | 0 refills | Status: DC

## 2022-03-24 MED ORDER — AMPHETAMINE-DEXTROAMPHETAMINE 10 MG PO TABS: 10.0000 mg | ORAL_TABLET | Freq: Every evening | ORAL | 0 refills | Status: DC

## 2022-03-24 NOTE — Telephone Encounter (Signed)
Dr. Barbie Banner patient.  Last refill-02/13/22-30 tabs, 0 refills Last OV CPE-10/25/21  No future OV scheduled.

## 2022-09-12 ENCOUNTER — Other Ambulatory Visit: Payer: Self-pay | Admitting: Adult Health

## 2022-09-13 MED ORDER — AMPHETAMINE-DEXTROAMPHET ER 20 MG PO CP24: 20.0000 mg | ORAL_CAPSULE | ORAL | 0 refills | Status: DC

## 2022-09-13 MED ORDER — AMPHETAMINE-DEXTROAMPHETAMINE 10 MG PO TABS: 10.0000 mg | ORAL_TABLET | Freq: Every evening | ORAL | 0 refills | Status: DC

## 2022-09-13 NOTE — Telephone Encounter (Signed)
Done

## 2022-12-13 ENCOUNTER — Other Ambulatory Visit: Payer: Self-pay | Admitting: Family Medicine

## 2022-12-15 NOTE — Telephone Encounter (Signed)
FYI pt advised to call and schedule a f/u for refill on this Rx

## 2022-12-16 MED ORDER — AMPHETAMINE-DEXTROAMPHETAMINE 10 MG PO TABS: 10.0000 mg | ORAL_TABLET | Freq: Every evening | ORAL | 0 refills | Status: DC

## 2022-12-16 MED ORDER — AMPHETAMINE-DEXTROAMPHET ER 20 MG PO CP24: 20.0000 mg | ORAL_CAPSULE | ORAL | 0 refills | Status: AC

## 2022-12-16 MED ORDER — AMPHETAMINE-DEXTROAMPHET ER 20 MG PO CP24: 20.0000 mg | ORAL_CAPSULE | ORAL | 0 refills | Status: DC

## 2022-12-16 MED ORDER — AMPHETAMINE-DEXTROAMPHETAMINE 10 MG PO TABS: 10.0000 mg | ORAL_TABLET | Freq: Every evening | ORAL | 0 refills | Status: AC

## 2022-12-16 NOTE — Telephone Encounter (Signed)
Done

## 2023-01-24 ENCOUNTER — Telehealth: Payer: BC Managed Care – PPO | Admitting: Physician Assistant

## 2023-01-24 DIAGNOSIS — R399 Unspecified symptoms and signs involving the genitourinary system: Secondary | ICD-10-CM | POA: Diagnosis not present

## 2023-01-24 MED ORDER — NITROFURANTOIN MONOHYD MACRO 100 MG PO CAPS: 100.0000 mg | ORAL_CAPSULE | Freq: Two times a day (BID) | ORAL | 0 refills | Status: DC

## 2023-01-24 MED ORDER — NITROFURANTOIN MONOHYD MACRO 100 MG PO CAPS: 100.0000 mg | ORAL_CAPSULE | Freq: Two times a day (BID) | ORAL | 0 refills | Status: AC

## 2023-01-24 NOTE — Progress Notes (Signed)
E-Visit for Urinary Problems  We are sorry that you are not feeling well.  Here is how we plan to help!  Based on what you shared with me it looks like you most likely have a simple urinary tract infection.  A UTI (Urinary Tract Infection) is a bacterial infection of the bladder.  Most cases of urinary tract infections are simple to treat but a key part of your care is to encourage you to drink plenty of fluids and watch your symptoms carefully.  I have prescribed MacroBid 100 mg twice a day for 5 days.  Your symptoms should gradually improve. Call us if the burning in your urine worsens, you develop worsening fever, back pain or pelvic pain or if your symptoms do not resolve after completing the antibiotic.  Urinary tract infections can be prevented by drinking plenty of water to keep your body hydrated.  Also be sure when you wipe, wipe from front to back and don't hold it in!  If possible, empty your bladder every 4 hours.  HOME CARE Drink plenty of fluids Compete the full course of the antibiotics even if the symptoms resolve Remember, when you need to go.go. Holding in your urine can increase the likelihood of getting a UTI! GET HELP RIGHT AWAY IF: You cannot urinate You get a high fever Worsening back pain occurs You see blood in your urine You feel sick to your stomach or throw up You feel like you are going to pass out  MAKE SURE YOU  Understand these instructions. Will watch your condition. Will get help right away if you are not doing well or get worse.   Thank you for choosing an e-visit.  Your e-visit answers were reviewed by a board certified advanced clinical practitioner to complete your personal care plan. Depending upon the condition, your plan could have included both over the counter or prescription medications.  Please review your pharmacy choice. Make sure the pharmacy is open so you can pick up prescription now. If there is a problem, you may contact your  provider through MyChart messaging and have the prescription routed to another pharmacy.  Your safety is important to us. If you have drug allergies check your prescription carefully.   For the next 24 hours you can use MyChart to ask questions about today's visit, request a non-urgent call back, or ask for a work or school excuse. You will get an email in the next two days asking about your experience. I hope that your e-visit has been valuable and will speed your recovery.   I have spent 5 minutes in review of e-visit questionnaire, review and updating patient chart, medical decision making and response to patient.   Adea Geisel Z Ward, PA-C    

## 2023-01-24 NOTE — Addendum Note (Signed)
Addended by: Waldon Merl on: 01/24/2023 06:54 PM   Modules accepted: Orders

## 2023-09-08 ENCOUNTER — Encounter: Payer: Self-pay | Admitting: Nurse Practitioner

## 2023-09-08 ENCOUNTER — Ambulatory Visit: Payer: Self-pay | Admitting: Nurse Practitioner

## 2023-09-08 VITALS — BP 120/70 | HR 86 | Ht 68.25 in | Wt 231.0 lb

## 2023-09-08 DIAGNOSIS — Z01419 Encounter for gynecological examination (general) (routine) without abnormal findings: Secondary | ICD-10-CM

## 2023-09-08 DIAGNOSIS — Z30431 Encounter for routine checking of intrauterine contraceptive device: Secondary | ICD-10-CM

## 2023-09-08 DIAGNOSIS — Z3009 Encounter for other general counseling and advice on contraception: Secondary | ICD-10-CM

## 2023-09-08 DIAGNOSIS — R7303 Prediabetes: Secondary | ICD-10-CM | POA: Diagnosis not present

## 2023-09-08 DIAGNOSIS — Z1331 Encounter for screening for depression: Secondary | ICD-10-CM

## 2023-09-08 DIAGNOSIS — E7800 Pure hypercholesterolemia, unspecified: Secondary | ICD-10-CM

## 2023-09-08 NOTE — Progress Notes (Signed)
 Sylvia Bell 09-21-1975 [id number]   History:  48 y.o. [id number] presents for annual exam. Mirena  IUD 09/2015, occasional spotting. Normal pap history. Mother (in her 2s) and MGM with history of breast cancer, mother and patient decline genetic testing. Down 30 pounds.   Gynecologic History No LMP recorded. (Menstrual status: IUD).   Contraception/Family planning: IUD Sexually active: Yes  Health Maintenance Last Pap: 10/11/2021. Results were: Normal neg HPV Last mammogram: 11/24/2015. Results were: Normal Last colonoscopy: Never Last Dexa: Not indicated     09/08/2023    8:26 AM  Depression screen PHQ 2/9  Decreased Interest 0  Down, Depressed, Hopeless 0  PHQ - 2 Score 0     Past medical history, past surgical history, family history and social history were all reviewed and documented in the EPIC chart. 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 67 yo son, moved back home, working.  Mother and MGM with history of breast cancer.   ROS:  A ROS was performed and pertinent positives and negatives are included.  Exam:  Vitals:   09/08/23 0821  BP: 120/70  Pulse: 86  SpO2: 97%  Weight: 231 lb (104.8 kg)  Height: 5' 8.25 (1.734 m)    Body mass index is 34.87 kg/m.  General appearance:  Normal Thyroid :  Symmetrical, normal in size, without palpable masses or nodularity. Respiratory  Auscultation:  Clear without wheezing or rhonchi Cardiovascular  Auscultation:  Regular rate, without rubs, murmurs or gallops  Edema/varicosities:  Not grossly evident Abdominal  Soft,nontender, without masses, guarding or rebound.  Liver/spleen:  No organomegaly noted  Hernia:  None appreciated  Skin  Inspection:  Grossly normal Breasts: Examined lying and sitting.   Right: Without masses, retractions, nipple discharge or axillary adenopathy.   Left: Without masses, retractions, nipple discharge or axillary adenopathy. Pelvic: External genitalia:  no  lesions              Urethra:  normal appearing urethra with no masses, tenderness or lesions              Bartholins and Skenes: normal                 Vagina: normal appearing vagina with normal color and discharge, no lesions              Cervix: no lesions. + IUD strings Bimanual Exam:  Uterus:  no masses or tenderness              Adnexa: no mass, fullness, tenderness              Rectovaginal: Deferred              Anus:  normal, no lesions  Zada Louder, CMA present as chaperone.   Assessment/Plan:  48 y.o. [id number] for annual exam.   Well female exam with routine gynecological exam - Plan: CBC with Differential/Platelet, Comprehensive metabolic panel with GFR, TSH. Education provided on SBEs, importance of preventative screenings, current guidelines, high calcium diet, regular exercise, and multivitamin daily.   Encounter for routine checking of intrauterine contraceptive device (IUD) - Mirena  IUD 09/2015, occasional spotting. Will return for exchange.   Elevated LDL cholesterol level - Plan: Lipid panel  Prediabetes - Plan: Hemoglobin A1c  Screening for cervical cancer - Normal Pap history.  Will repeat at 5-year interval per guidelines.  Screening for breast cancer - Normal mammogram history, although way overdue. Planning to do mobile mammogram through work this month.  Normal breast exam today. Mother (in her 71s) and MGM with history of breast cancer, mother and patient decline genetic testing.   Screening for colon cancer - Discussed colon cancer screening options. Average risk.   Return in about 1 year (around 09/07/2024) for Annual.     Annabella DELENA Shutter DNP, 8:56 AM 09/08/2023

## 2023-09-09 LAB — COMPREHENSIVE METABOLIC PANEL WITH GFR
AG Ratio: 1.7 (calc) (ref 1.0–2.5)
ALT: 14 U/L (ref 6–29)
AST: 13 U/L (ref 10–35)
Albumin: 4.3 g/dL (ref 3.6–5.1)
Alkaline phosphatase (APISO): 57 U/L (ref 31–125)
BUN: 11 mg/dL (ref 7–25)
CO2: 26 mmol/L (ref 20–32)
Calcium: 9.3 mg/dL (ref 8.6–10.2)
Chloride: 106 mmol/L (ref 98–110)
Creat: 0.82 mg/dL (ref 0.50–0.99)
Globulin: 2.6 g/dL (ref 1.9–3.7)
Glucose, Bld: 94 mg/dL (ref 65–99)
Potassium: 4.2 mmol/L (ref 3.5–5.3)
Sodium: 141 mmol/L (ref 135–146)
Total Bilirubin: 0.4 mg/dL (ref 0.2–1.2)
Total Protein: 6.9 g/dL (ref 6.1–8.1)
eGFR: 88 mL/min/1.73m2 (ref >=60)

## 2023-09-09 LAB — CBC WITH DIFFERENTIAL/PLATELET
Absolute Lymphocytes: 1759 {cells}/uL (ref 850–3900)
Absolute Monocytes: 423 {cells}/uL (ref 200–950)
Basophils Absolute: 22 {cells}/uL (ref 0–200)
Basophils Relative: 0.3 %
Eosinophils Absolute: 124 {cells}/uL (ref 15–500)
Eosinophils Relative: 1.7 %
HCT: 44.8 % (ref 35.0–45.0)
Hemoglobin: 13.8 g/dL (ref 11.7–15.5)
MCH: 27.7 pg (ref 27.0–33.0)
MCHC: 30.8 g/dL — ABNORMAL LOW (ref 32.0–36.0)
MCV: 90 fL (ref 80.0–100.0)
MPV: 12.6 fL — ABNORMAL HIGH (ref 7.5–12.5)
Monocytes Relative: 5.8 %
Neutro Abs: 4971 {cells}/uL (ref 1500–7800)
Neutrophils Relative %: 68.1 %
Platelets: 209 Thousand/uL (ref 140–400)
RBC: 4.98 Million/uL (ref 3.80–5.10)
RDW: 13.1 % (ref 11.0–15.0)
Total Lymphocyte: 24.1 %
WBC: 7.3 Thousand/uL (ref 3.8–10.8)

## 2023-09-09 LAB — LIPID PANEL
Cholesterol: 213 mg/dL — ABNORMAL HIGH (ref <200)
HDL: 44 mg/dL — ABNORMAL LOW (ref >=50)
LDL Cholesterol (Calc): 146 mg/dL — ABNORMAL HIGH
Non-HDL Cholesterol (Calc): 169 mg/dL — ABNORMAL HIGH (ref <130)
Total CHOL/HDL Ratio: 4.8 (calc) (ref <5.0)
Triglycerides: 112 mg/dL (ref <150)

## 2023-09-09 LAB — HEMOGLOBIN A1C
Hgb A1c MFr Bld: 5.5 % (ref <5.7)
Mean Plasma Glucose: 111 mg/dL
eAG (mmol/L): 6.2 mmol/L

## 2023-09-09 LAB — TSH: TSH: 1.31 m[IU]/L

## 2023-09-11 ENCOUNTER — Ambulatory Visit: Payer: Self-pay | Admitting: Nurse Practitioner

## 2023-11-10 ENCOUNTER — Encounter: Payer: Self-pay | Admitting: Nurse Practitioner

## 2023-11-10 ENCOUNTER — Ambulatory Visit (INDEPENDENT_AMBULATORY_CARE_PROVIDER_SITE_OTHER): Admitting: Nurse Practitioner

## 2023-11-10 VITALS — BP 122/82 | HR 78 | Wt 217.4 lb

## 2023-11-10 DIAGNOSIS — Z01812 Encounter for preprocedural laboratory examination: Secondary | ICD-10-CM

## 2023-11-10 DIAGNOSIS — Z30433 Encounter for removal and reinsertion of intrauterine contraceptive device: Secondary | ICD-10-CM | POA: Diagnosis not present

## 2023-11-10 LAB — PREGNANCY, URINE: Preg Test, Ur: NEGATIVE

## 2023-11-10 MED ORDER — LEVONORGESTREL 20 MCG/DAY IU IUD
1.0000 | INTRAUTERINE_SYSTEM | Freq: Once | INTRAUTERINE | Status: AC
  Administered 2023-11-10: 1 via INTRAUTERINE

## 2023-11-10 NOTE — Progress Notes (Signed)
   Pierina Schuknecht May 10, 1975 [id number]   History:  48 y.o. [id number] presents for exchange of Mirena  IUD.  Pt has been counseled about risks and benefits as well as complications.   No LMP recorded. (Menstrual status: IUD). STD testing: Declines  Past medical history, past surgical history, family history and social history were all reviewed and documented in the EPIC chart.  ROS:  A ROS was performed and pertinent positives and negatives are included.  Exam: Vitals:   11/10/23 0852  BP: 122/82  Pulse: 78  SpO2: 99%  Weight: 217 lb 6.4 oz (98.6 kg)   Body mass index is 32.81 kg/m.  Pelvic exam: Vulva:  normal female genitalia Vagina:  normal vagina, no discharge, exudate, lesion, or erythema Cervix:  Non-tender, Negative CMT, no lesions or redness. Uterus:  normal shape, position and consistency   Procedure:  Timeout performed and written consent obtained.  Speculum inserted. Cervix visualized, IUD string grasped with ring forceps and removed with ease. Cervix cleansed with Betadine x 3. Sterile gloves applied. Tenaculum placed on anterior cervix. Then uterus sounded to 9 cm. IUD inserted easily. Strings trimmed to 2.5 cm.  Minimal bleeding noted.  Pt tolerated the procedure well.  Geni Pica, CMA present to assist.   Assessment/Plan:  Insertion of Mirena  IUD             UPT neg   Return for recheck 4-6 weeks Pt aware to call for any concerns Pt aware removal due no later than 8 years from insertion date, IUD card given to pt.   Annabella DELENA Shutter DNP, 9:18 AM 11/10/2023

## 2023-12-08 ENCOUNTER — Ambulatory Visit: Admitting: Nurse Practitioner

## 2023-12-08 ENCOUNTER — Encounter: Payer: Self-pay | Admitting: Nurse Practitioner

## 2023-12-08 VITALS — BP 104/72 | HR 86 | Ht 68.25 in | Wt 215.0 lb

## 2023-12-08 DIAGNOSIS — Z30431 Encounter for routine checking of intrauterine contraceptive device: Secondary | ICD-10-CM

## 2023-12-08 MED ORDER — LEVONORGESTREL 20 MCG/DAY IU IUD: 1.0000 | INTRAUTERINE_SYSTEM | Freq: Once | INTRAUTERINE | Status: AC

## 2023-12-08 NOTE — Progress Notes (Signed)
     History:  48 y.o. [id number] here today for today for IUD string check; Mirena  IUD was placed 11/10/2023. No complaints about the IUD, no concerning side effects.  The following portions of the patient's history were reviewed and updated as appropriate: allergies, current medications, past family history, past medical history, past social history, past surgical history and problem list. Last pap smear on 10/11/2021 was normal, neg HR HPV.  Review of Systems:  Pertinent items are noted in HPI.   Objective:  Physical Exam Blood pressure 104/72, pulse 86, height 5' 8.25 (1.734 m), weight 215 lb (97.5 kg), SpO2 98%. Gen: NAD Abd: Soft, nontender and nondistended Pelvic: Normal appearing external genitalia; normal appearing vaginal mucosa and cervix.  IUD strings visualized, about 2 cm in length outside cervix.  Chaperone offered and declined.  Assessment & Plan:  Normal IUD check. Patient may keep IUD in place for up to 8 years. May remove sooner if she desires pregnancy, or has side effects within that time.   Sylvia Shutter, DNP
